# Patient Record
Sex: Female | Born: 1975 | Hispanic: No | Marital: Married | Smoking: Never smoker
Health system: Southern US, Community
[De-identification: ages and names within clinical notes are randomized; demographics above are authoritative.]

## PROBLEM LIST (undated history)

## (undated) DIAGNOSIS — R002 Palpitations: Secondary | ICD-10-CM

## (undated) DIAGNOSIS — J302 Other seasonal allergic rhinitis: Secondary | ICD-10-CM

## (undated) DIAGNOSIS — Z8719 Personal history of other diseases of the digestive system: Secondary | ICD-10-CM

## (undated) DIAGNOSIS — R112 Nausea with vomiting, unspecified: Secondary | ICD-10-CM

## (undated) DIAGNOSIS — T7840XA Allergy, unspecified, initial encounter: Secondary | ICD-10-CM

## (undated) DIAGNOSIS — Z860100 Personal history of colon polyps, unspecified: Secondary | ICD-10-CM

## (undated) DIAGNOSIS — K219 Gastro-esophageal reflux disease without esophagitis: Secondary | ICD-10-CM

## (undated) HISTORY — DX: Palpitations: R00.2

## (undated) HISTORY — DX: Gastro-esophageal reflux disease without esophagitis: K21.9

## (undated) HISTORY — DX: Personal history of colonic polyps: Z86.010

## (undated) HISTORY — PX: GANGLION CYST EXCISION: SHX1691

## (undated) HISTORY — DX: Allergy, unspecified, initial encounter: T78.40XA

## (undated) HISTORY — DX: Personal history of colon polyps, unspecified: Z86.0100

## (undated) HISTORY — DX: Other seasonal allergic rhinitis: J30.2

## (undated) HISTORY — PX: COLONOSCOPY: SHX174

## (undated) HISTORY — PX: CHOLECYSTECTOMY: SHX55

## (undated) HISTORY — PX: EYE SURGERY: SHX253

## (undated) HISTORY — DX: Personal history of other diseases of the digestive system: Z87.19

---

## 2004-01-18 ENCOUNTER — Other Ambulatory Visit: Admission: RE | Admit: 2004-01-18 | Discharge: 2004-01-18 | Payer: Self-pay | Admitting: Obstetrics and Gynecology

## 2005-03-06 ENCOUNTER — Other Ambulatory Visit: Admission: RE | Admit: 2005-03-06 | Discharge: 2005-03-06 | Payer: Self-pay | Admitting: Obstetrics and Gynecology

## 2015-08-29 DIAGNOSIS — J3089 Other allergic rhinitis: Secondary | ICD-10-CM | POA: Insufficient documentation

## 2017-01-07 DIAGNOSIS — K8020 Calculus of gallbladder without cholecystitis without obstruction: Secondary | ICD-10-CM | POA: Insufficient documentation

## 2018-04-16 ENCOUNTER — Other Ambulatory Visit: Payer: Self-pay | Admitting: Obstetrics and Gynecology

## 2018-04-16 DIAGNOSIS — R928 Other abnormal and inconclusive findings on diagnostic imaging of breast: Secondary | ICD-10-CM

## 2018-04-22 ENCOUNTER — Ambulatory Visit: Payer: Self-pay

## 2018-04-22 ENCOUNTER — Ambulatory Visit
Admission: RE | Admit: 2018-04-22 | Discharge: 2018-04-22 | Disposition: A | Discharge status: Home / Self Care | Payer: PRIVATE HEALTH INSURANCE | Source: Ambulatory Visit | Attending: Obstetrics and Gynecology | Admitting: Obstetrics and Gynecology

## 2018-04-22 DIAGNOSIS — R928 Other abnormal and inconclusive findings on diagnostic imaging of breast: Secondary | ICD-10-CM

## 2019-07-30 ENCOUNTER — Encounter: Payer: Self-pay | Admitting: Gastroenterology

## 2019-09-02 ENCOUNTER — Encounter: Payer: Self-pay | Admitting: Emergency Medicine

## 2019-09-02 ENCOUNTER — Other Ambulatory Visit: Payer: Self-pay | Admitting: Emergency Medicine

## 2019-09-07 ENCOUNTER — Encounter: Payer: Self-pay | Admitting: Gastroenterology

## 2019-09-07 ENCOUNTER — Ambulatory Visit: Payer: PRIVATE HEALTH INSURANCE | Admitting: Gastroenterology

## 2019-09-07 ENCOUNTER — Ambulatory Visit: Payer: Self-pay | Admitting: Gastroenterology

## 2019-09-07 ENCOUNTER — Encounter (INDEPENDENT_AMBULATORY_CARE_PROVIDER_SITE_OTHER): Payer: Self-pay

## 2019-09-07 VITALS — BP 130/80 | HR 83 | Temp 97.6°F | Ht 65.0 in | Wt 150.0 lb

## 2019-09-07 DIAGNOSIS — K219 Gastro-esophageal reflux disease without esophagitis: Secondary | ICD-10-CM | POA: Diagnosis not present

## 2019-09-07 DIAGNOSIS — K921 Melena: Secondary | ICD-10-CM | POA: Diagnosis not present

## 2019-09-07 NOTE — Progress Notes (Signed)
Referring Provider: No ref. provider found Primary Care Physician:  Nicholes Rough, PA-C  Reason for Consultation:  GERD and Rectal bleeding   IMPRESSION:  GERD without alarm features    - improved after one month of H2B OTC and subsequent 2 weeks of PPI OTC Rectal pain and bleeding in January    - responded to two weeks of conservative measures    - no further bleeding since that time Abdominal pain with stress Cholecystectomy 3 years ago No family history of colon cancer or polyps Father with d-IBS treated with Lotronex  Rectal bleeding was likely a fissure. But, the differential is broad and also includes hemorrhoids, as well as polyps, mass, ulcers, and colitis.  Given this differential I am recommending a colonoscopy.  No ongoing reflux at this time. No alarm features. She will resume daily PPI therapy if symptoms recur and call me.   PLAN: Reviewed lifestyle modifications PPI as needed for recurrent symptoms Colonoscopy  I consented the patient at the bedside today discussing the risks, benefits, and alternatives to endoscopic evaluation. In particular, we discussed the risks that include, but are not limited to, reaction to medication, cardiopulmonary compromise, bleeding requiring blood transfusion, aspiration resulting in pneumonia, perforation requiring surgery, lack of diagnosis, severe illness requiring hospitalization, and even death. We reviewed the risk of missed lesion including polyps or even cancer. The patient acknowledges these risks and asks that we proceed.  Please see the "Patient Instructions" section for addition details about the plan.  HPI: Claire Flynn is a 43 y.o. female self-referred for a history of reflux and rectal bleeding. 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 The history is obtained through the patient. No records are available in EPIC or CareEverywhere. She has seasonal allergies and a cholecystectomy 3 years ago for cholelithiasis at [facility].   Reflux started in March 2020. She attributes it to being stress related from coronavirus. Triggered by some foods and a glass of wine.  Maybe some improvement with probiotic.  Pepcid 20 mg OTC for 30 days resolved the reflux.  Recurred a couple of weeks later. Trial of Prilosec OTC x 14 days. No reflux since that time. Started keto three weeks ago. Huge dietary change. No cough, sore throat, neck pain, dysphonia. No nocturnal symptoms although she noticed the heartburn more in the evening. Some weight gain due to Covid. Carries stress in back and neck.   Hemorroids for years. First noticed bleeding last year. Some BRB on the toilet paper occurred in January. At that time she had pain with defecation and blood in the water and on the TP. She wonders now if it might be a fissure. Increased fiber. Hydrocortisone cream increased. Daily Metamucil. Improved in 2 weeks. No further bleeding since that time. No other associated symptoms. No identified exacerbating or relieving features.   Abdominal pain with stress. Some foods may trigger diarrhea such a greasy foods. No constipation.  Bowel movements are regular.  Increase in diarrhea after cholecystectomy, but, things are now improved.   Alcohol - Mostly wine and beer - 3 times weekly. Caffeine - coffee daily and sodas 2-3 times weekly. Keto beverage has some coffeeine.   Uses Vitamin E, Vitamin D, Allegra. TrueBiotic for overall gut health.   Father had severe diarrhea IBS that was diagnosed in his 50-50s. Controlled on Lotronex. No known family history of colon cancer or polyps. No family history of uterine/endometrial cancer, pancreatic cancer or gastric/stomach cancer.   Past  Medical History:  Diagnosis Date  . Seasonal allergies     Past Surgical History:  Procedure Laterality Date  . CHOLECYSTECTOMY    . GANGLION CYST EXCISION      Current Outpatient Medications  Medication Sig Dispense Refill  . Cholecalciferol  (VITAMIN D3) 50 MCG (2000 UT) capsule Vitamin D 2,000 unit capsule   1 capsule every day by oral route.    . fexofenadine (ALLEGRA ALLERGY) 180 MG tablet Take 180 mg by mouth daily.    . norethindrone-ethinyl estradiol (JUNEL 1/20) 1-20 MG-MCG tablet Take 1 tablet by mouth daily.    . Olopatadine HCl (PATANASE) 0.6 % SOLN Place 0.6 sprays into the nose 2 (two) times daily.    . Probiotic Product (PROBIOTIC PO) Probiotic    . vitamin E 200 UNIT capsule vitamin E 200 unit capsule   1 capsule every day by oral route.     No current facility-administered medications for this visit.     Allergies as of 09/07/2019  . (No Known Allergies)    Family History  Problem Relation Age of Onset  . Hypertension Mother   . Hypertension Father   . Melanoma Father     Social History   Socioeconomic History  . Marital status: Unknown    Spouse name: Not on file  . Number of children: Not on file  . Years of education: Not on file  . Highest education level: Not on file  Occupational History  . Not on file  Social Needs  . Financial resource strain: Not on file  . Food insecurity    Worry: Not on file    Inability: Not on file  . Transportation needs    Medical: Not on file    Non-medical: Not on file  Tobacco Use  . Smoking status: Never Smoker  . Smokeless tobacco: Never Used  Substance and Sexual Activity  . Alcohol use: Yes  . Drug use: Never  . Sexual activity: Not on file  Lifestyle  . Physical activity    Days per week: Not on file    Minutes per session: Not on file  . Stress: Not on file  Relationships  . Social Herbalist on phone: Not on file    Gets together: Not on file    Attends religious service: Not on file    Active member of club or organization: Not on file    Attends meetings of clubs or organizations: Not on file    Relationship status: Not on file  . Intimate partner violence    Fear of current or ex partner: Not on file    Emotionally  abused: Not on file    Physically abused: Not on file    Forced sexual activity: Not on file  Other Topics Concern  . Not on file  Social History Narrative  . Not on file    Review of Systems: 12 system ROS is negative except as noted above.   Physical Exam: General:   Alert,  well-nourished, pleasant and cooperative in NAD Head:  Normocephalic and atraumatic. Eyes:  Sclera clear, no icterus.   Conjunctiva pink. Ears:  Normal auditory acuity. Nose:  No deformity, discharge,  or lesions. Mouth:  No deformity or lesions.   Neck:  Supple; no masses or thyromegaly. Lungs:  Clear throughout to auscultation.   No wheezes. Heart:  Regular rate and rhythm; no murmurs. Abdomen:  Soft,nontender, nondistended, normal bowel sounds, no rebound or guarding. No hepatosplenomegaly.  Rectal:  Deferred  Msk:  Symmetrical. No boney deformities LAD: No inguinal or umbilical LAD Extremities:  No clubbing or edema. Neurologic:  Alert and  oriented x4;  grossly nonfocal Skin:  Intact without significant lesions or rashes. Psych:  Alert and cooperative. Normal mood and affect.    Claire Flynn L. Tarri Glenn, MD, MPH 09/07/2019, 6:42 PM

## 2019-09-07 NOTE — Patient Instructions (Signed)
It has been recommended to you by your physician that you have a(n) colonoscopy completed. Per your request, we did not schedule the procedure today. Please contact our office at [phone] should you decide to have the procedure completed.

## 2019-09-20 ENCOUNTER — Other Ambulatory Visit: Payer: Self-pay | Admitting: Emergency Medicine

## 2019-09-20 DIAGNOSIS — K921 Melena: Secondary | ICD-10-CM

## 2019-09-20 MED ORDER — NA SULFATE-K SULFATE-MG SULF 17.5-3.13-1.6 GM/177ML PO SOLN: 1.0000 | ORAL | 0 refills | Status: AC

## 2019-09-20 NOTE — Progress Notes (Signed)
Patient scheduled for 10-25-2019 with Dr Fuller Plan per patients request.

## 2019-09-24 ENCOUNTER — Other Ambulatory Visit: Payer: Self-pay | Admitting: Orthopaedic Surgery

## 2019-09-24 MED ORDER — DOXYCYCLINE HYCLATE 100 MG PO TABS: 100.0000 mg | ORAL_TABLET | Freq: Two times a day (BID) | ORAL | 0 refills | Status: DC

## 2019-10-21 ENCOUNTER — Encounter: Payer: Self-pay | Admitting: Gastroenterology

## 2019-10-25 ENCOUNTER — Other Ambulatory Visit: Payer: Self-pay

## 2019-10-25 ENCOUNTER — Ambulatory Visit (AMBULATORY_SURGERY_CENTER): Payer: PRIVATE HEALTH INSURANCE | Admitting: Gastroenterology

## 2019-10-25 ENCOUNTER — Other Ambulatory Visit: Payer: Self-pay | Admitting: Gastroenterology

## 2019-10-25 ENCOUNTER — Encounter: Payer: Self-pay | Admitting: Gastroenterology

## 2019-10-25 VITALS — BP 124/84 | HR 84 | Temp 98.9°F | Resp 16 | Ht 65.0 in | Wt 150.0 lb

## 2019-10-25 DIAGNOSIS — D122 Benign neoplasm of ascending colon: Secondary | ICD-10-CM

## 2019-10-25 DIAGNOSIS — D125 Benign neoplasm of sigmoid colon: Secondary | ICD-10-CM

## 2019-10-25 DIAGNOSIS — D123 Benign neoplasm of transverse colon: Secondary | ICD-10-CM

## 2019-10-25 DIAGNOSIS — K921 Melena: Secondary | ICD-10-CM | POA: Diagnosis present

## 2019-10-25 DIAGNOSIS — K644 Residual hemorrhoidal skin tags: Secondary | ICD-10-CM | POA: Diagnosis not present

## 2019-10-25 DIAGNOSIS — K5730 Diverticulosis of large intestine without perforation or abscess without bleeding: Secondary | ICD-10-CM

## 2019-10-25 DIAGNOSIS — K648 Other hemorrhoids: Secondary | ICD-10-CM | POA: Diagnosis not present

## 2019-10-25 MED ORDER — SODIUM CHLORIDE 0.9 % IV SOLN: 500.0000 mL | Freq: Once | INTRAVENOUS | Status: DC

## 2019-10-25 NOTE — Progress Notes (Signed)
Temperature- [place]

## 2019-10-25 NOTE — Progress Notes (Signed)
Called to room to assist during endoscopic procedure.  Patient ID and intended procedure confirmed with present staff. Received instructions for my participation in the procedure from the performing physician.  

## 2019-10-25 NOTE — Patient Instructions (Signed)
Thank you for allowing [place] to care for you today!  Await pathology results by mail, approximately 2 weeks.  Recommendation for next colonoscopy will be made at that time.  Surveillance colonoscopy with Dr Tarri Glenn.  Resume previous diet today.  Return to your normal activities tomorrow.  Recommend high fiber diet.  Resume present medications today, but avoiding aspirin, ibuprofen,  naproxen ( ALL NSAIDS) for 2 weeks.  May use Anusol HC cream and  RectiCare both per rectum twice per day as needed.       YOU HAD AN ENDOSCOPIC PROCEDURE TODAY AT [facility]:   Refer to the procedure report that was given to you for any specific questions about what was found during the examination.  If the procedure report does not answer your questions, please call your gastroenterologist to clarify.  If you requested that your care partner not be given the details of your procedure findings, then the procedure report has been included in a sealed envelope for you to review at your convenience later.  YOU SHOULD EXPECT: Some feelings of bloating in the abdomen. Passage of more gas than usual.  Walking can help get rid of the air that was put into your GI tract during the procedure and reduce the bloating. If you had a lower endoscopy (such as a colonoscopy or flexible sigmoidoscopy) you may notice spotting of blood in your stool or on the toilet paper. If you underwent a bowel prep for your procedure, you may not have a normal bowel movement for a few days.  Please Note:  You might notice some irritation and congestion in your nose or some drainage.  This is from the oxygen used during your procedure.  There is no need for concern and it should clear up in a day or so.  SYMPTOMS TO REPORT IMMEDIATELY:   Following lower endoscopy (colonoscopy or flexible sigmoidoscopy):  Excessive amounts of blood in the stool  Significant tenderness or worsening of abdominal pains  Swelling of the abdomen  that is new, acute  Fever of 100F or higher   For urgent or emergent issues, a gastroenterologist can be reached at any hour by calling [phone].   DIET:  We do recommend a small meal at first, but then you may proceed to your regular diet.  Drink plenty of fluids but you should avoid alcoholic beverages for 24 hours.  ACTIVITY:  You should plan to take it easy for the rest of today and you should NOT DRIVE or use heavy machinery until tomorrow (because of the sedation medicines used during the test).    FOLLOW UP: Our staff will call the number listed on your records 48-72 hours following your procedure to check on you and address any questions or concerns that you may have regarding the information given to you following your procedure. If we do not reach you, we will leave a message.  We will attempt to reach you two times.  During this call, we will ask if you have developed any symptoms of COVID 19. If you develop any symptoms (ie: fever, flu-like symptoms, shortness of breath, cough etc.) before then, please call [phone].  If you test positive for Covid 19 in the 2 weeks post procedure, please call and report this information to [place].    If any biopsies were taken you will be contacted by phone or by letter within the next 1-3 weeks.  Please call us at [phone] if you have not heard  about the biopsies in 3 weeks.    SIGNATURES/CONFIDENTIALITY: You and/or your care partner have signed paperwork which will be entered into your electronic medical record.  These signatures attest to the fact that that the information above on your After Visit Summary has been reviewed and is understood.  Full responsibility of the confidentiality of this discharge information lies with you and/or your care-partner.

## 2019-10-25 NOTE — Op Note (Addendum)
[place] Patient Name: Claire Flynn Procedure Date: 10/25/2019 8:01 AM MRN: [id number] Endoscopist: Ladene Artist , MD Age: 43 Referring MD:  Date of Birth: Apr 27, 1976 Gender: Female Account #: [id number] Procedure:                Colonoscopy Indications:              Hematochezia Medicines:                Monitored Anesthesia Care Procedure:                Pre-Anesthesia Assessment:                           - Prior to the procedure, a History and Physical                            was performed, and patient medications and                            allergies were reviewed. The patient's tolerance of                            previous anesthesia was also reviewed. The risks                            and benefits of the procedure and the sedation                            options and risks were discussed with the patient.                            All questions were answered, and informed consent                            was obtained. Prior Anticoagulants: The patient has                            taken no previous anticoagulant or antiplatelet                            agents. ASA Grade Assessment: II - A patient with                            mild systemic disease. After reviewing the risks                            and benefits, the patient was deemed in                            satisfactory condition to undergo the procedure.                           After obtaining informed consent, the colonoscope  was passed under direct vision. Throughout the                            procedure, the patient's blood pressure, pulse, and                            oxygen saturations were monitored continuously. The                            Colonoscope was introduced through the anus and                            advanced to the the terminal ileum, with                            identification of the appendiceal orifice and IC                      valve. The ileocecal valve, appendiceal orifice,                            and rectum were photographed. The quality of the                            bowel preparation was excellent. The colonoscopy                            was performed without difficulty. The patient                            tolerated the procedure well. Scope In: 8:13:43 AM Scope Out: 8:31:14 AM Scope Withdrawal Time: 0 hours 15 minutes 22 seconds  Total Procedure Duration: 0 hours 17 minutes 31 seconds  Findings:                 Skin tags were found on perianal exam. No fissure                            noted.                           The terminal ileum appeared normal.                           A 9 mm polyp was found in the sigmoid colon. The                            polyp was semi-pedunculated. The polyp was removed                            with a cold snare. Resection and retrieval were                            complete.  Two sessile polyps were found in the transverse                            colon and ascending colon. The polyps were 7 to 8                            mm in size. These polyps were removed with a cold                            snare. Resection and retrieval were complete.                           A few medium-mouthed diverticula were found in the                            left colon. There was no evidence of diverticular                            bleeding.                           Internal hemorrhoids were found during                            retroflexion. The hemorrhoids were small and Grade                            I (internal hemorrhoids that do not prolapse).                           The exam was otherwise without abnormality on                            direct and retroflexion views. Complications:            No immediate complications. Estimated blood loss:                            None. Estimated Blood Loss:      Estimated blood loss: none. Impression:               - Perianal skin tags found on perianal exam.                           - Normal appearing terminal ileum.                           - One 9 mm polyp in the sigmoid colon, removed with                            a cold snare. Resected and retrieved.                           - Two 7 to 8 mm polyps in the transverse colon and  in the ascending colon, removed with a cold snare.                            Resected and retrieved.                           - Mild diverticulosis in the left colon.                           - Internal hemorrhoids.                           - The examination was otherwise normal on direct                            and retroflexion views. Recommendation:           - Repeat colonoscopy after studies are complete for                            surveillance based on pathology results with Dr.                            Tarri Glenn.                           - Patient has a contact number available for                            emergencies. The signs and symptoms of potential                            delayed complications were discussed with the                            patient. Return to normal activities tomorrow.                            Written discharge instructions were provided to the                            patient.                           - High fiber diet.                           - Continue present medications.                           - No aspirin, ibuprofen, naproxen, or other                            non-steroidal anti-inflammatory drugs for 2 weeks                            after polyp removal.                           -  Anusol HC cream PR bid prn. RectiCare PR bid prn Ladene Artist, MD 10/25/2019 8:37:24 AM This report has been signed electronically.

## 2019-10-25 NOTE — Progress Notes (Signed)
Report given to PACU, vss 

## 2019-10-26 ENCOUNTER — Telehealth: Payer: Self-pay | Admitting: Gastroenterology

## 2019-10-27 ENCOUNTER — Telehealth: Payer: Self-pay

## 2019-10-27 ENCOUNTER — Encounter: Payer: Self-pay | Admitting: Gastroenterology

## 2019-10-27 NOTE — Telephone Encounter (Signed)
The note I saw indicated she was going for a covid-19 test which was reasonable. I assumed one of the nurses called her back.

## 2019-10-27 NOTE — Telephone Encounter (Signed)
  Follow up Call-  Call back number 10/25/2019  Post procedure Call Back phone  # [phone]  Permission to leave phone message Yes  Some recent data might be hidden     Patient questions:  Do you have a fever, pain , or abdominal swelling? No. Pain Score  0 *  Have you tolerated food without any problems? Yes.    Have you been able to return to your normal activities? Yes.    Do you have any questions about your discharge instructions: Diet   No. Medications  No. Follow up visit  No.  Do you have questions or concerns about your Care? Yes.    Pt states she called yesterday and no one called her back, states she had a temp of 99 and lost taste and smell.  States she had a covid test and has not received results yet.  Pt states she will let us know if it is positive.  Actions: * If pain score is 4 or above: Physician/ provider Notified : Lucio Edward, MD.  1. Have you developed a fever since your procedure? no  2.   Have you had an respiratory symptoms (SOB or cough) since your procedure? Yes- lost taste and smell  3.   Have you tested positive for COVID 19 since your procedure no, results not back yet.  4.   Have you had any family members/close contacts diagnosed with the COVID 19 since your procedure?  no   If yes to any of these questions please route to Joylene John, RN and Alphonsa Gin, Therapist, sports.

## 2019-10-27 NOTE — Telephone Encounter (Signed)
Positive Covid-19 following colonoscopy noted. Please advise her to contact her PCP for Covid-19 care and follow up. We wish her a speedy and uneventful recovery.

## 2019-10-29 ENCOUNTER — Telehealth: Payer: Self-pay

## 2019-10-29 NOTE — Telephone Encounter (Signed)
Contacted patient for follow up on covid positive diagnosis.  Patient states she is 'doing fine' with no significant problems.  Is quarantining at home.  Advised per Dr. Fuller Plan to follow up with PCP.  Patient states she received covid test and instructions via hospital testing but has not yet talked to PCP.  Advised Dr. Fuller Plan recommended notification of PCP and do not delay in seeking care if any developing concerns.  Patient acknowledged understanding.  Nothing further needed.

## 2019-11-14 IMAGING — MG DIGITAL DIAGNOSTIC UNILATERAL RIGHT MAMMOGRAM WITH TOMO AND CAD
4 series · 4 of 12 positions shown · non-contrast
Comparison: 04/14/2018.

CLINICAL DATA: Recall from initial baseline screening mammography
with tomosynthesis, possible asymmetry involving the INNER RIGHT
breast visible only on the CC images.

EXAM:
DIGITAL DIAGNOSTIC UNILATERAL RIGHT MAMMOGRAM WITH CAD AND TOMO

[R CC synth-2D]
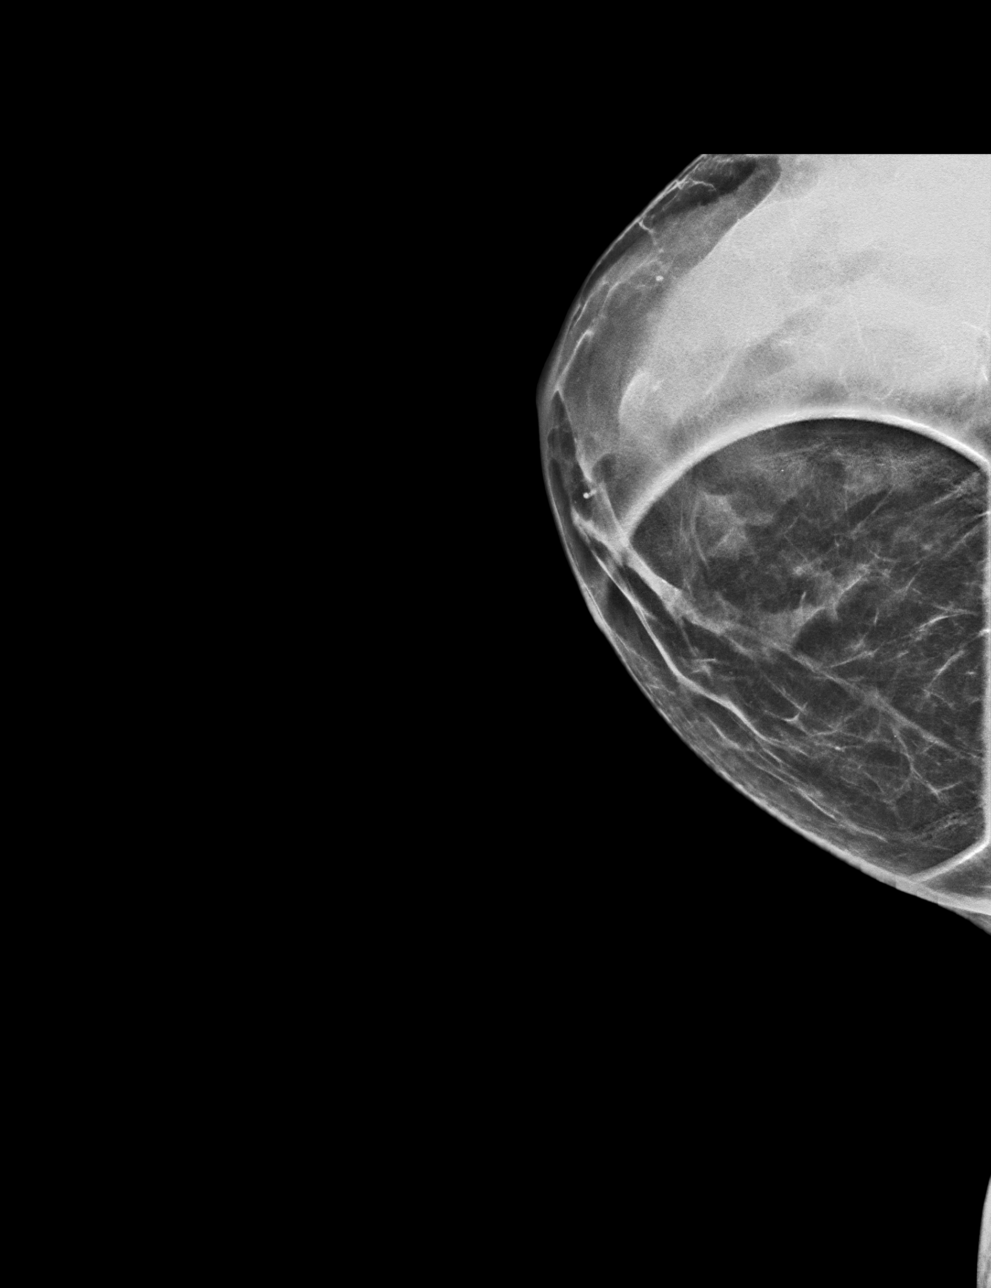

[R ML synth-2D]
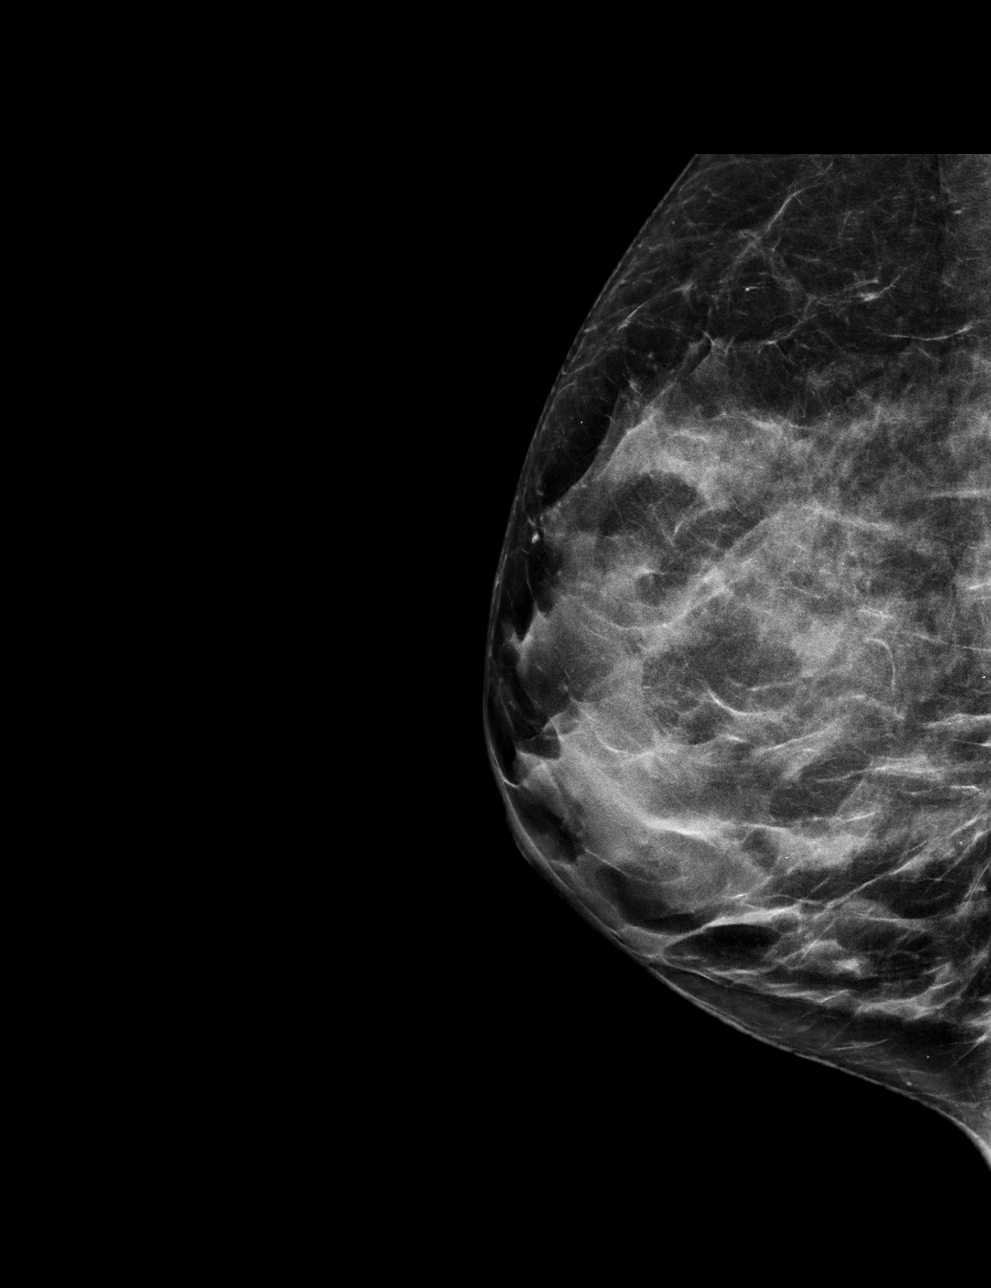

[R CC tomo · tomo slice 27/53.0]
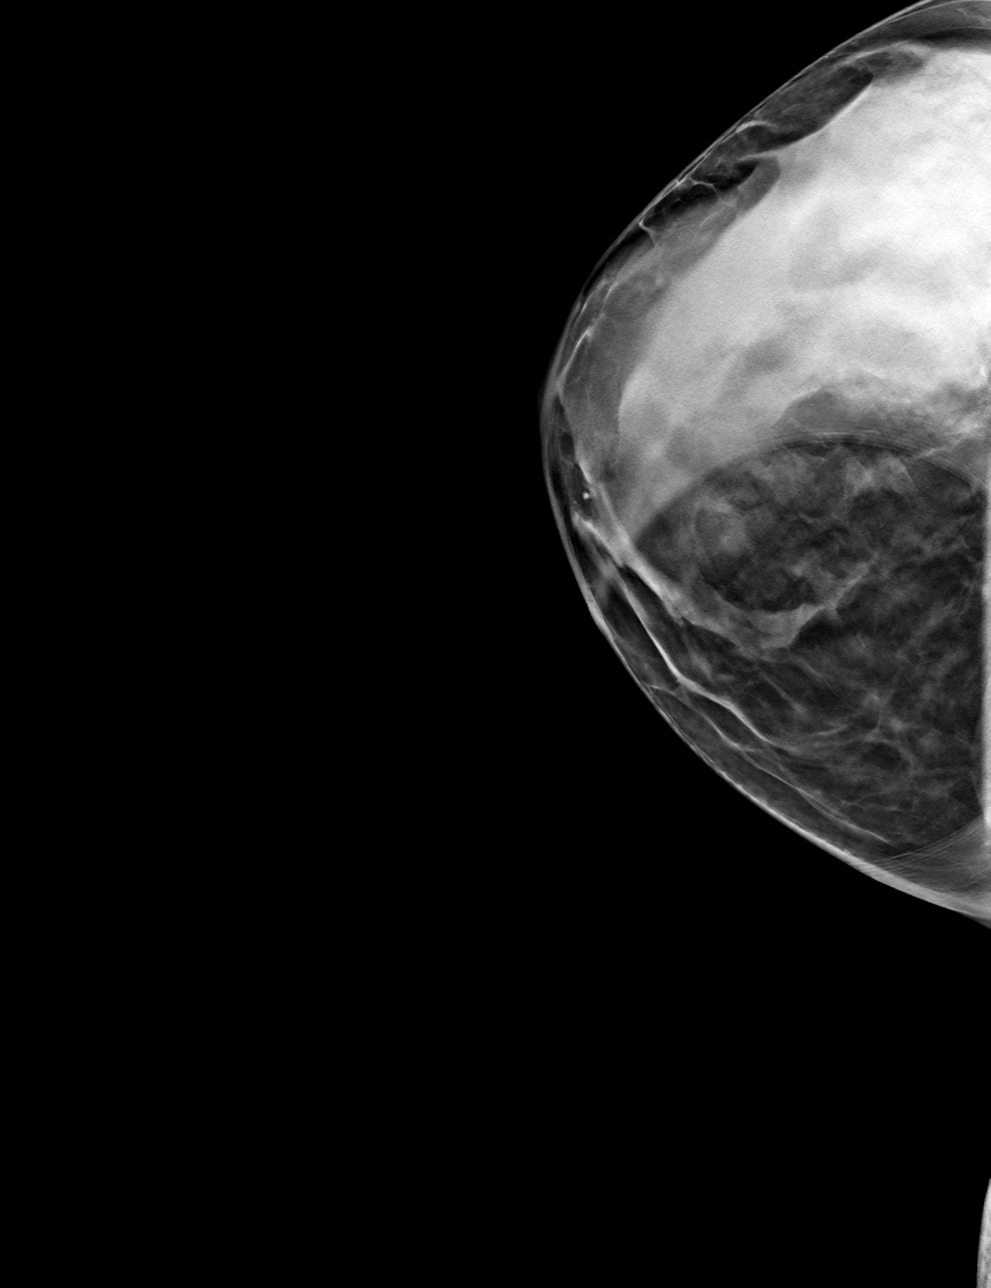

[R ML tomo · tomo slice 31/61.0]
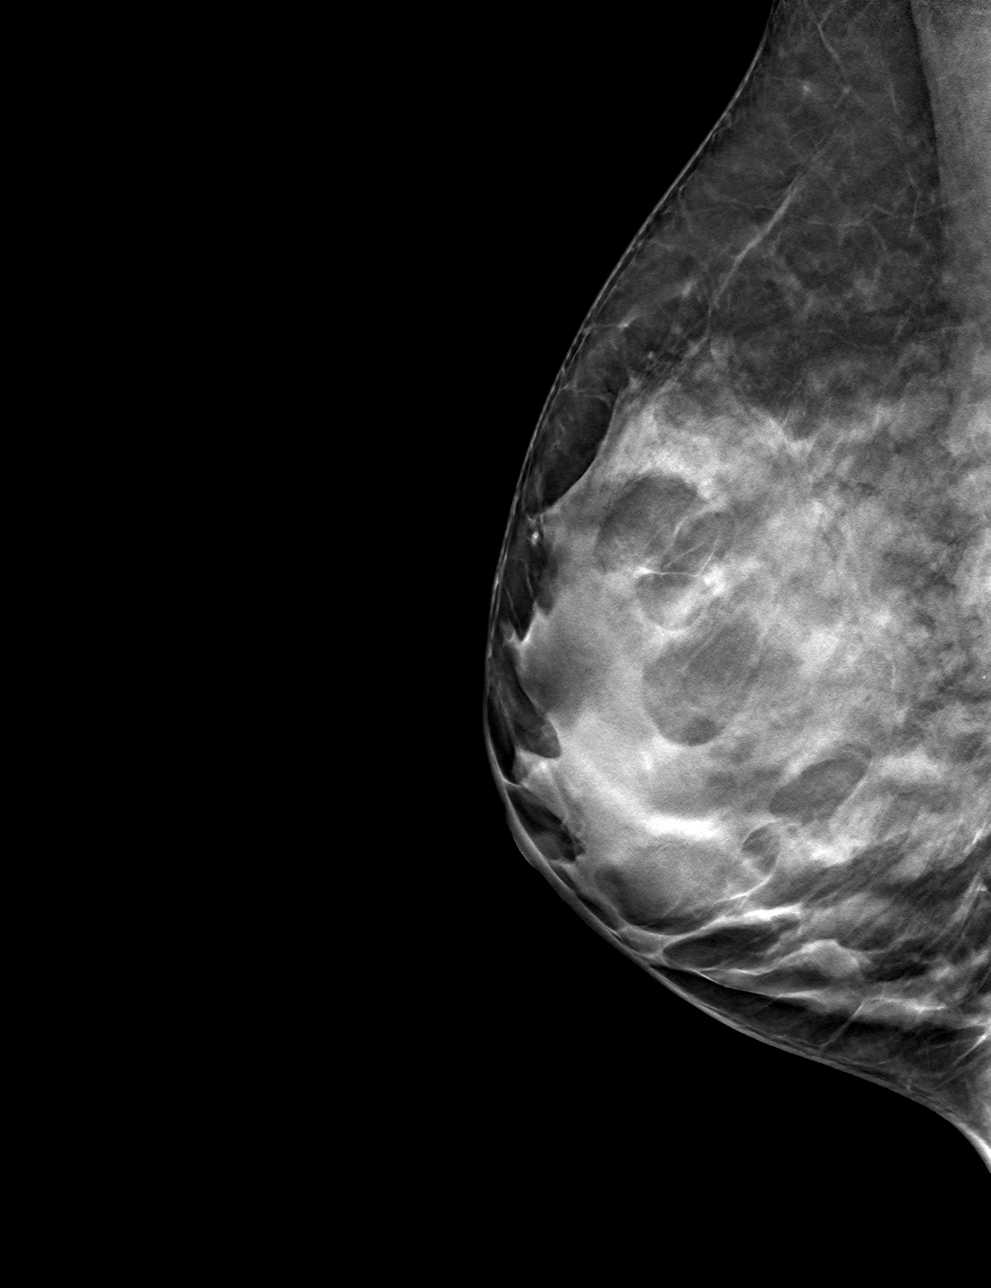

[4 of 12 positions shown; findings below may reference images not displayed]

ACR Breast Density Category d: The breast tissue is extremely dense,
which lowers the sensitivity of mammography.
FINDINGS: Tomosynthesis and synthesized spot-compression CC view of the area
of concern in the RIGHT breast and a tomosynthesis and synthesized
full field mediolateral view of the RIGHT breast were obtained.

The asymmetry involving the INNER RIGHT breast disperses with
compression and there is no underlying mass or architectural
distortion.

No findings suspicious for malignancy in the RIGHT breast

The full field mediolateral image was processed with CAD.
IMPRESSION: 1. No mammographic evidence of malignancy involving the RIGHT
breast.
2. Asymmetric normal fibroglandular tissue accounts for the
screening mammographic asymmetry.

RECOMMENDATION:
Screening mammogram in one year.(Code:[id number])

I have discussed the findings and recommendations with the patient.
Results were also provided in writing at the conclusion of the
visit. If applicable, a reminder letter will be sent to the patient
regarding the next appointment.

BI-RADS CATEGORY  1: Negative.

## 2020-03-06 ENCOUNTER — Telehealth: Payer: Self-pay | Admitting: Gastroenterology

## 2020-03-06 NOTE — Telephone Encounter (Signed)
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 Please advise

## 2020-03-06 NOTE — Telephone Encounter (Signed)
I am sorry that she is having so much difficulty despite medical therapy. We do not excise thrombosed hemorrhoids here. I believe that [facility] has a daily walk in clinic for this concern. Please see if they could see her today. Thank you.

## 2020-03-06 NOTE — Telephone Encounter (Signed)
Dr. Tarri Glenn, this patient is c/o intense pain from her hemorrhoids that she thinks "is pretty sure thrombosed." She reports the pain radiates to her groin, she has used prep H, suppositories, and sitz baths. Please advise.  She mentioned "draining the clot" from the hemorrhoid. Please advise. No same day appointment available.

## 2020-03-06 NOTE — Telephone Encounter (Addendum)
Called [facility], after collecting insurance information, they forwarded me to the triage nurse who scheduled her at 3/16 at 10 am (arrival), 10:30 am appt time. Patient notified.

## 2020-03-06 NOTE — Telephone Encounter (Signed)
Pt would like to discuss her issue with hemorrhoids.

## 2020-09-27 ENCOUNTER — Ambulatory Visit: Payer: PRIVATE HEALTH INSURANCE | Admitting: Plastic Surgery

## 2020-09-27 ENCOUNTER — Encounter: Payer: Self-pay | Admitting: Plastic Surgery

## 2020-09-27 ENCOUNTER — Other Ambulatory Visit: Payer: Self-pay

## 2020-09-27 VITALS — BP 127/85 | HR 79 | Temp 98.8°F | Ht 65.0 in | Wt 145.2 lb

## 2020-09-27 DIAGNOSIS — M79645 Pain in left finger(s): Secondary | ICD-10-CM | POA: Diagnosis not present

## 2020-09-27 DIAGNOSIS — M25532 Pain in left wrist: Secondary | ICD-10-CM | POA: Diagnosis not present

## 2020-09-27 NOTE — Progress Notes (Signed)
Referring Provider Nicholes Rough, PA-C [place],  [place] [address]   CC:  Chief Complaint  Patient presents with  . Advice Only      Claire Flynn is an 44 y.o. female.  HPI: Patient presents to discuss her left wrist.  She has had problems on and off with it for several years.  She has had steroid injections in the past that have given her sustained relief.  She has had x-rays in the past with no obvious findings according to her.  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  This is on the ulnar side of both wrists.  Additionally in the left hand she is noticed tenderness over the volar aspect in the small finger MP joint area.  She has not had any locking or triggering.  She is interested in additional steroid injections.  No Known Allergies  Outpatient Encounter Medications as of 09/27/2020  Medication Sig  . Cholecalciferol (VITAMIN D3) 50 MCG (2000 UT) capsule Vitamin D 2,000 unit capsule   1 capsule every day by oral route.  . fexofenadine (ALLEGRA ALLERGY) 180 MG tablet Take 180 mg by mouth daily.  . fluticasone (FLONASE) 50 MCG/ACT nasal spray 2 (two) times daily.  . norethindrone-ethinyl estradiol (JUNEL 1/20) 1-20 MG-MCG tablet Take 1 tablet by mouth daily.  . Olopatadine HCl (PATANASE) 0.6 % SOLN Place 0.6 sprays into the nose 2 (two) times daily.  . Probiotic Product (PROBIOTIC PO) Probiotic  . vitamin E 200 UNIT capsule vitamin E 200 unit capsule   1 capsule every day by oral route.  . doxycycline (VIBRA-TABS) 100 MG tablet Take 1 tablet (100 mg total) by mouth 2 (two) times daily.   No facility-administered encounter medications on file as of 09/27/2020.     Past Medical History:  Diagnosis Date  . Allergy   . GERD (gastroesophageal reflux disease)    occasionally reflux  . Seasonal allergies     Past Surgical History:   Procedure Laterality Date  . CHOLECYSTECTOMY    . GANGLION CYST EXCISION      Family History  Problem Relation Age of Onset  . Hypertension Mother   . Hypertension Father   . Melanoma Father   . Colon cancer Neg Hx   . Esophageal cancer Neg Hx   . Stomach cancer Neg Hx   . Rectal cancer Neg Hx     Social History   Social History Narrative  . Not on file     Review of Systems General: Denies fevers, chills, weight loss CV: Denies chest pain, shortness of breath, palpitations  Physical Exam Vitals with BMI 09/27/2020 10/25/2019 10/25/2019  Height 5\' 5"  - -  Weight 145 lbs 3 oz - -  BMI 18.56 - -  Systolic 314 970 263  Diastolic 85 84 80  Pulse 79 84 81    General:  No acute distress,  Alert and oriented, Non-Toxic, Normal speech and affect Left hand: Fingers well-perfused with normal cap refill and a palp radial pulse.  Sensation is intact throughout.  She has normal strength and range of motion.  She is tender in the area of the ulnar fovea.  Resisted pronation and supination reproduce the pain.  Shucking of the DRUJ does not reproduce the pain.  She is focally tender over the volar aspect of the fifth finger MP joint close to  the area of the A1 pulley.  She has not tender dorsally or on the ulnar aspect of the joint.  She has good range of motion but notices tightness with full effort flexion.  She does have a palpable nodule in the small finger flexor tendons.  Assessment/Plan Patient presents with chronic left wrist pain that she attributes to repetitive movements and retraction.  She has had steroid injections in the past for this which have helped.  We discussed performing an additional injection today and following this up with an x-ray to look for any other changes.  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  We also discussed the potential for rheumatoid pathology but she does not have pain in any other joints.  Regarding the small finger MP tenderness I think  the most likely explanation is an early trigger finger type phenomenon.  The joint itself does not swell and is completely nontender dorsally.  The tenderness is focused in the area of the A1 pulley volarly.  I recommend steroid injection for both of these problems.  We discussed the risk that include bleeding, infection, damage to surrounding structures and need for additional procedures.  Both areas were prepped with an alcohol pad.  Local anesthesia and Kenalog was injected in the area of the TFCC from an ulnar approach and also in the area of the A1 pulley to the small finger.  She tolerated this well.  I will plan to order an x-ray for her and call her with results.  Cindra Presume 09/27/2020, 9:24 PM

## 2020-09-29 ENCOUNTER — Ambulatory Visit (HOSPITAL_COMMUNITY)
Admission: RE | Admit: 2020-09-29 | Discharge: 2020-09-29 | Disposition: A | Discharge status: Home / Self Care | Payer: PRIVATE HEALTH INSURANCE | Source: Ambulatory Visit | Attending: Plastic Surgery | Admitting: Plastic Surgery

## 2020-09-29 ENCOUNTER — Other Ambulatory Visit: Payer: Self-pay

## 2020-09-29 DIAGNOSIS — M25532 Pain in left wrist: Secondary | ICD-10-CM | POA: Diagnosis not present

## 2020-10-31 ENCOUNTER — Encounter: Payer: Self-pay | Admitting: Cardiology

## 2020-10-31 ENCOUNTER — Ambulatory Visit: Payer: PRIVATE HEALTH INSURANCE | Admitting: Cardiology

## 2020-10-31 ENCOUNTER — Other Ambulatory Visit: Payer: Self-pay

## 2020-10-31 ENCOUNTER — Other Ambulatory Visit (INDEPENDENT_AMBULATORY_CARE_PROVIDER_SITE_OTHER): Payer: PRIVATE HEALTH INSURANCE

## 2020-10-31 VITALS — BP 126/84 | HR 99 | Ht 65.0 in | Wt 141.0 lb

## 2020-10-31 DIAGNOSIS — R002 Palpitations: Secondary | ICD-10-CM

## 2020-10-31 NOTE — Progress Notes (Signed)
Electrophysiology Office Note   Date:  10/31/2020   ID:  Claire Flynn, DOB [birth year], MRN [id number]  PCP:  Claire Rough, PA-C  Cardiologist:   Primary Electrophysiologist:  Nayson Traweek Meredith Leeds, MD    Chief Complaint: Palpitations   History of Present Illness: Claire Flynn is a 44 y.o. female who is being seen today for the evaluation of palpitations at the request of Claire Flynn, [place]. Presenting today for electrophysiology evaluation.  She presents today for the evaluation of palpitations.  She was diagnosed with Covid approximately 1 year ago.  She fully recovered.  She had done well up until she went on vacation the week before Halloween.  She had approximately 3-4 drinks a day during that vacation where she usually has 3 to 4/week.  When she returned home, she started having palpitations.  She says that her palpitations feel like a pause followed by a very strong beat.  These occurred for multiple days in a row, and then went away.  They have come back and make her feel weak and fatigued, and she is quite worried about her overall symptoms.  Last Sunday, she awoke and felt poorly, felt her pulse and was found to be irregularly irregular.  Throughout the day, this feeling went away, but she continued to have similar palpitations as above.  Today, she denies symptoms of palpitations, chest pain, shortness of breath, orthopnea, PND, lower extremity edema, claudication, dizziness, presyncope, syncope, bleeding, or neurologic sequela. The patient is tolerating medications without difficulties.    Past Medical History:  Diagnosis Date  . Allergy   . GERD (gastroesophageal reflux disease)    occasionally reflux  . Palpitations   . Seasonal allergies    Past Surgical History:  Procedure Laterality Date  . CHOLECYSTECTOMY    . GANGLION CYST EXCISION       Current Outpatient Medications  Medication Sig Dispense Refill  . APPLE CIDER VINEGAR PO Take 480 mg by mouth daily.    . B  Complex Vitamins (VITAMIN B COMPLEX) TABS Take by mouth daily.    . Cholecalciferol (VITAMIN D3) 50 MCG (2000 UT) capsule Vitamin D 2,000 unit capsule   1 capsule every day by oral route.    . fexofenadine (ALLEGRA ALLERGY) 180 MG tablet Take 180 mg by mouth daily.    . fluticasone (FLONASE) 50 MCG/ACT nasal spray 2 (two) times daily.    . norethindrone-ethinyl estradiol (JUNEL 1/20) 1-20 MG-MCG tablet Take 1 tablet by mouth daily.    . Nutritional Supplements (VITAL HIGH PROTEIN PO) Take 10 mg by mouth daily.    . Olopatadine HCl (PATANASE) 0.6 % SOLN Place 0.6 sprays into the nose 2 (two) times daily.    . Probiotic Product (PROBIOTIC PO) Probiotic    . Specialty Vitamins Products (BIOTIN PLUS KERATIN PO) Take 10,000 mg by mouth daily.    . Vitamin E 180 MG (400 UNIT) CAPS Take by mouth daily.      No current facility-administered medications for this visit.    Allergies:   Patient has no known allergies.   Social History:  The patient  reports that she has never smoked. She has never used smokeless tobacco. She reports current alcohol use. She reports that she does not use drugs.   Family History:  The patient's family history includes Hypertension in her father and mother; Melanoma in her father.    ROS:  Please see the history of present illness.   Otherwise, review of systems is positive for  none.   All other systems are reviewed and negative.    PHYSICAL EXAM: VS:  BP 126/84   Pulse 99   Ht 5\' 5"  (1.651 m)   Wt 141 lb (64 kg)   SpO2 96%   BMI 23.46 kg/m  , BMI Body mass index is 23.46 kg/m. GEN: Well nourished, well developed, in no acute distress  HEENT: normal  Neck: no JVD, carotid bruits, or masses Cardiac: RRR; no murmurs, rubs, or gallops,no edema  Respiratory:  clear to auscultation bilaterally, normal work of breathing GI: soft, nontender, nondistended, + BS MS: no deformity or atrophy  Skin: warm and dry Neuro:  Strength and sensation are intact Psych:  euthymic mood, full affect  EKG:  EKG is ordered today. Personal review of the ekg ordered shows sinus rhythm, rate 99  Recent Labs: No results found for requested labs within last 8760 hours.    Lipid Panel  No results found for: CHOL, TRIG, HDL, CHOLHDL, VLDL, LDLCALC, LDLDIRECT   Wt Readings from Last 3 Encounters:  10/31/20 141 lb (64 kg)  09/27/20 145 lb 3.2 oz (65.9 kg)  10/25/19 150 lb (68 kg)      Other studies Reviewed: Additional studies/ records that were reviewed today include: Epic notes    ASSESSMENT AND PLAN:  1.  Palpitations: Unclear to me as to the cause of her palpitations.  There is no documented cardiac history, and her palpitations are worse since her vacation.  She initially thought that this was holiday heart, but had quite a bit of hydration.  He does sound like she may be having PVCs.  I Aleece Loyd have her wear a monitor for 1 week.    Current medicines are reviewed at length with the patient today.   The patient does not have concerns regarding her medicines.  The following changes were made today:  none  Labs/ tests ordered today include:  Orders Placed This Encounter  Procedures  . LONG TERM MONITOR (3-14 DAYS)  . EKG 12-Lead     Disposition:   FU with Gailen Venne 1 months  Signed, Earsel Shouse Meredith Leeds, MD  10/31/2020 10:52 AM     [facility] [address] [place] [address] [phone] (office) [phone] (fax)

## 2020-10-31 NOTE — Patient Instructions (Signed)
Medication Instructions:  Your physician recommends that you continue on your current medications as directed. Please refer to the Current Medication list given to you today.  *If you need a refill on your cardiac medications before your next appointment, please call your pharmacy*   Lab Work: None ordered   Testing/Procedures: Your physician has recommended that you wear a 7 DAY ZIO-PATCH monitor. The Zio patch cardiac monitor continuously records heart rhythm data, this is for patients being evaluated for multiple types heart rhythms. For the first 24 hours post application, please avoid getting the Zio monitor wet in the shower or by excessive sweating during exercise. After that, feel free to carry on with regular activities. Keep soaps and lotions away from the ZIO XT Patch.  Someone will be calling you to let you know when this is mailed.  This will be mailed to you, please expect 7-10 days to receive.      Call Gamaliel at [phone] if you have questions regarding your ZIO XT patch monitor.  Call them immediately if you see an orange light blinking on your monitor.   If your monitor falls off in less than 4 days contact our Monitor department at [phone].  If your monitor becomes loose or falls off after 4 days call [employer] at [phone] for suggestions on securing your monitor     Follow-Up: At [facility], you and your health needs are our priority.  As part of our continuing mission to provide you with exceptional heart care, we have created designated Provider Care Teams.  These Care Teams include your primary Cardiologist (physician) and Advanced Practice Providers (APPs -  Physician Assistants and Nurse Practitioners) who all work together to provide you with the care you need, when you need it.   Your next appointment:    to be determined after monitor is complete  The format for your next appointment:   In Person  Provider:     Allegra Lai, MD    Thank you for choosing [facility]!!   Trinidad Curet, RN [phone]   Other Instructions  ZIO XT- Long Term Monitor Instructions   Your physician has requested you wear your ZIO patch monitor_______days.   This is a single patch monitor.  [employer] supplies one patch monitor per enrollment.  Additional stickers are not available.   Please do not apply patch if you will be having a Nuclear Stress Test, Echocardiogram, Cardiac CT, MRI, or Chest Xray during the time frame you would be wearing the monitor. The patch cannot be worn during these tests.  You cannot remove and re-apply the ZIO XT patch monitor.   Your ZIO patch monitor will be sent USPS Priority mail from [facility] directly to your home address. The monitor may also be mailed to a PO BOX if home delivery is not available.   It may take 3-5 days to receive your monitor after you have been enrolled.   Once you have received you monitor, please review enclosed instructions.  Your monitor has already been registered assigning a specific monitor serial # to you.   Applying the monitor   Shave hair from upper left chest.   Hold abrader disc by orange tab.  Rub abrader in 40 strokes over left upper chest as indicated in your monitor instructions.   Clean area with 4 enclosed alcohol pads .  Use all pads to assure are is cleaned thoroughly.  Let dry.   Apply patch as indicated in  monitor instructions.  Patch will be place under collarbone on left side of chest with arrow pointing upward.   Rub patch adhesive wings for 2 minutes.Remove white label marked "1".  Remove white label marked "2".  Rub patch adhesive wings for 2 additional minutes.   While looking in a mirror, press and release button in center of patch.  A small green light will flash 3-4 times .  This will be your only indicator the monitor has been turned on.     Do not shower for the first 24 hours.  You may shower after the  first 24 hours.   Press button if you feel a symptom. You will hear a small click.  Record Date, Time and Symptom in the Patient Log Book.   When you are ready to remove patch, follow instructions on last 2 pages of Patient Log Book.  Stick patch monitor onto last page of Patient Log Book.   Place Patient Log Book in [place] box.  Use locking tab on box and tape box closed securely.  The [employer] has [employer] on it.  Please place in mailbox as soon as possible.  Your physician should have your test results approximately 7 days after the monitor has been mailed back to [facility].   Call [place] at [phone] if you have questions regarding your ZIO XT patch monitor.  Call them immediately if you see an orange light blinking on your monitor.   If your monitor falls off in less than 4 days contact our Monitor department at [phone].  If your monitor becomes loose or falls off after 4 days call [employer] at [phone] for suggestions on securing your monitor.

## 2020-11-07 ENCOUNTER — Ambulatory Visit: Payer: PRIVATE HEALTH INSURANCE | Admitting: Cardiology

## 2020-11-24 ENCOUNTER — Telehealth: Payer: Self-pay | Admitting: Cardiology

## 2020-11-24 NOTE — Telephone Encounter (Signed)
    Pt is returning Sherri's call to get heart monitor result

## 2020-11-24 NOTE — Telephone Encounter (Signed)
Discussed monitor findings. Pt reports occasional issues since she had Covid last year in November. Exercise tolerance has been ok. Currently experiencing slight cold. Reports BPs going up and down -- this is a concern/new She would like reassurance of what to do and what to look for. Aware I am still waiting to discuss f/u w/ Camnitz. Aware I will discuss w/ him next week and let her know. She is agreeable to virtual visit if he is agreeable.

## 2020-12-01 NOTE — Telephone Encounter (Signed)
Aware to f/u w/ PCP about BP concerns/issues. Aware I will let her know next week f/u recommendation. She is agreeable to plan

## 2020-12-11 NOTE — Telephone Encounter (Signed)
Left detailed message, apologized for not returning call last week as I said I would do. Informed PRN f/u w/ Dr. Curt Bears unless she is still experiencing issues.  Advised that if she has further issues at later date we would be more than willing to see her again. Advised to call if she needed to discuss further and/or needs to scheduled d/t continued palpitations.

## 2022-04-03 ENCOUNTER — Encounter: Payer: Self-pay | Admitting: Gastroenterology

## 2022-04-23 IMAGING — DX DG HAND COMPLETE 3+V*L*
3 series · 3 of 3 positions shown · non-contrast
Comparison: None.

CLINICAL DATA: Chronic LEFT pain

EXAM:
LEFT WRIST - COMPLETE 3+ VIEW; LEFT HAND - COMPLETE 3+ VIEW

[hand pa]
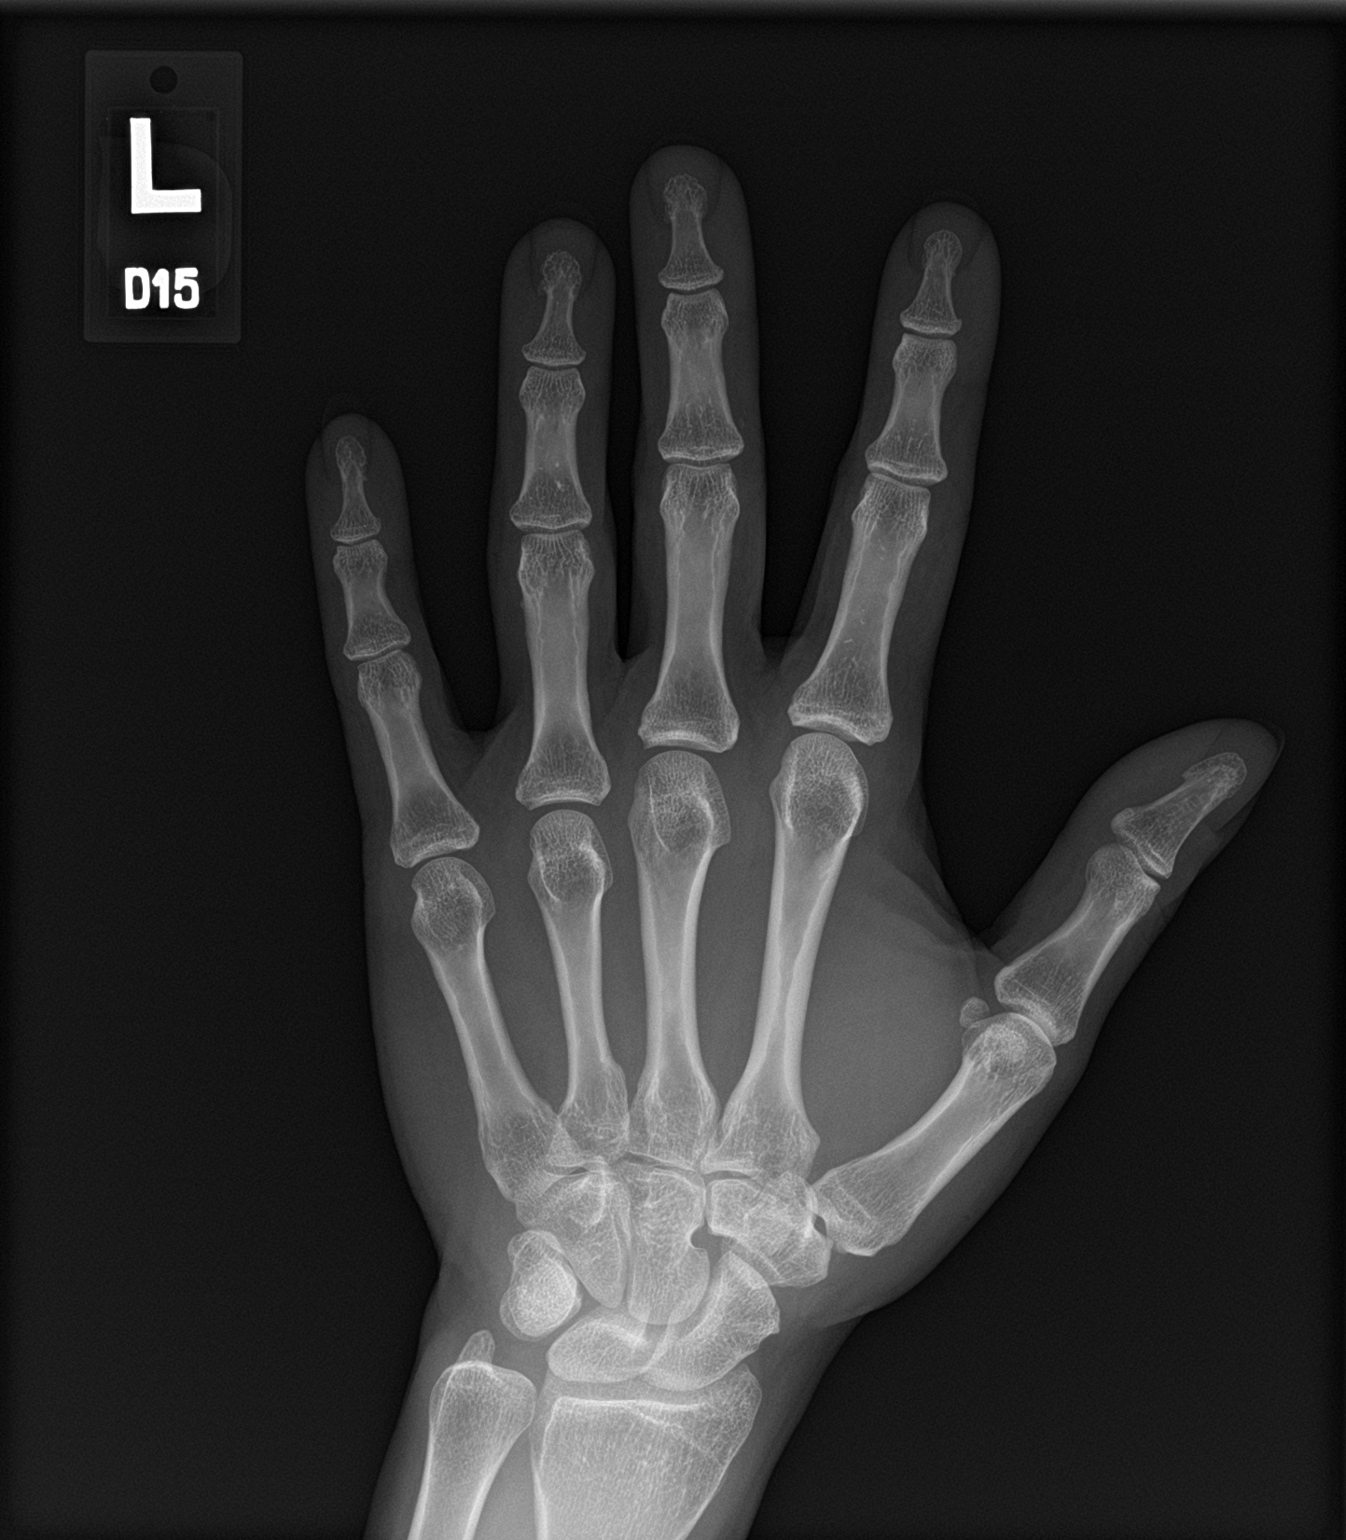

[hand obl]
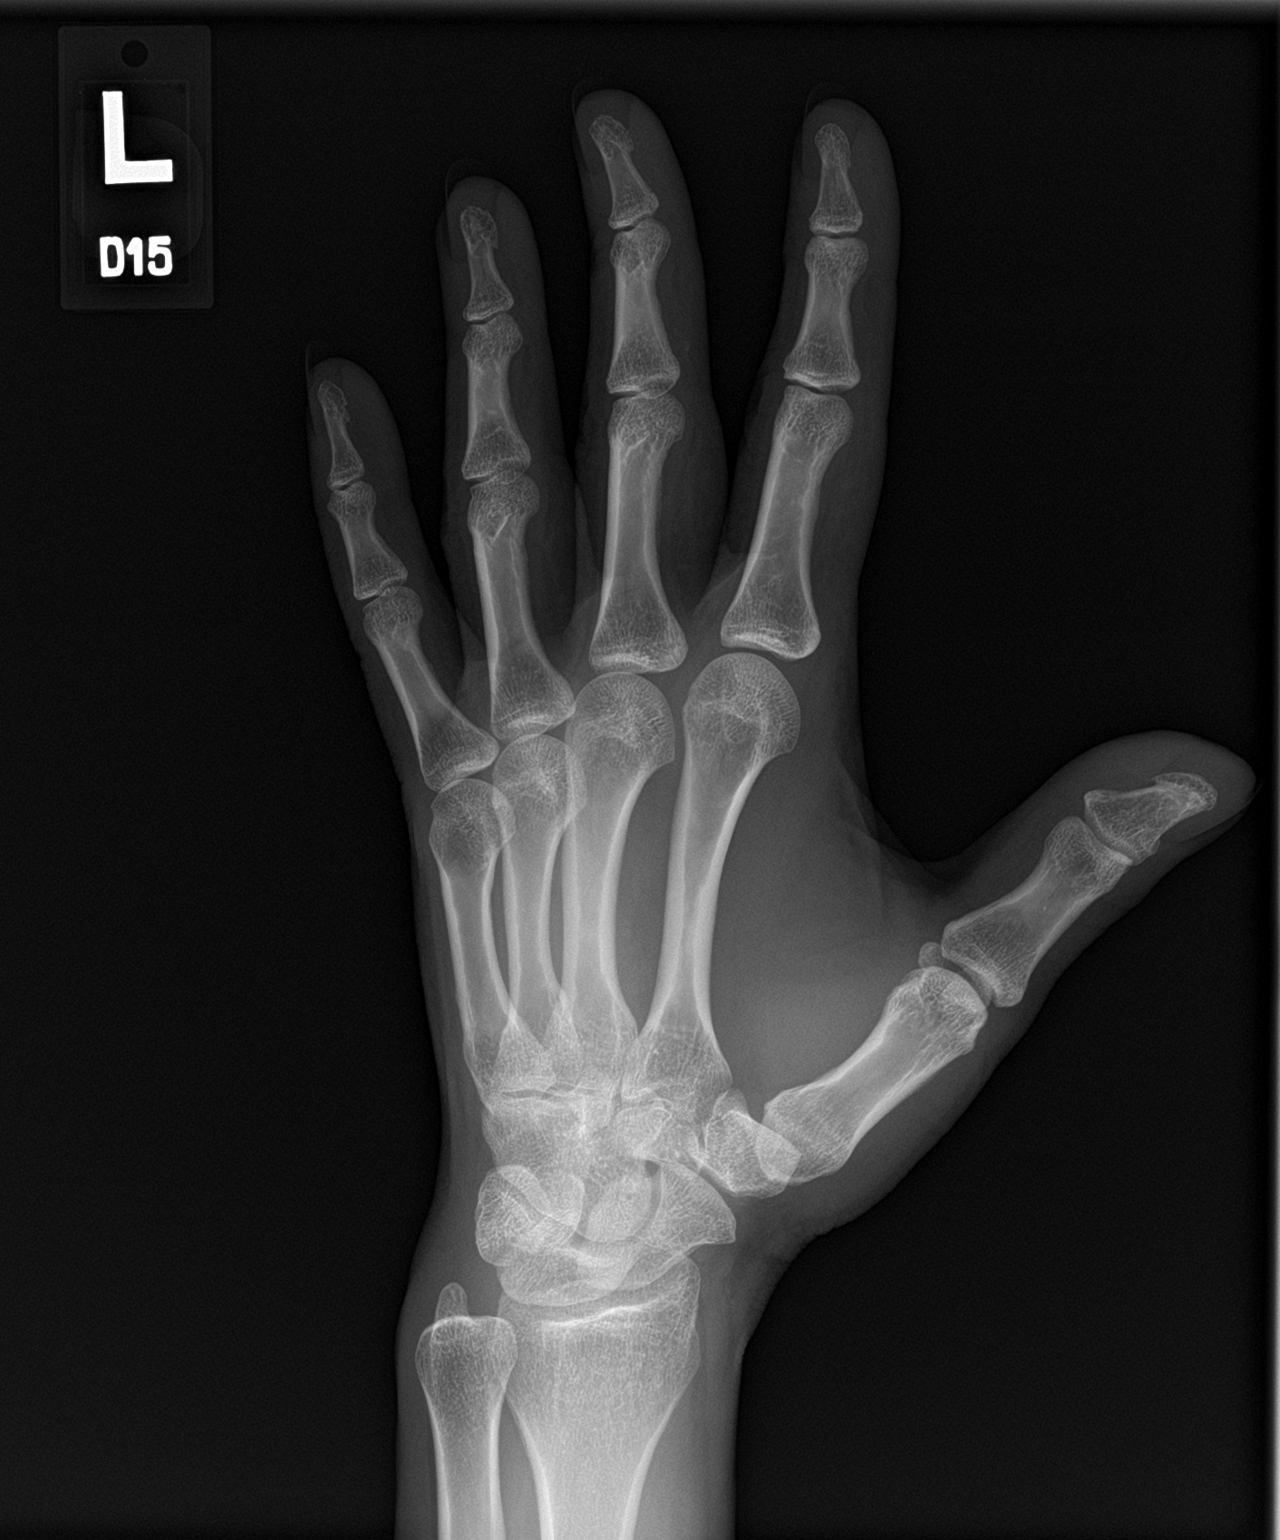

[hand lat]
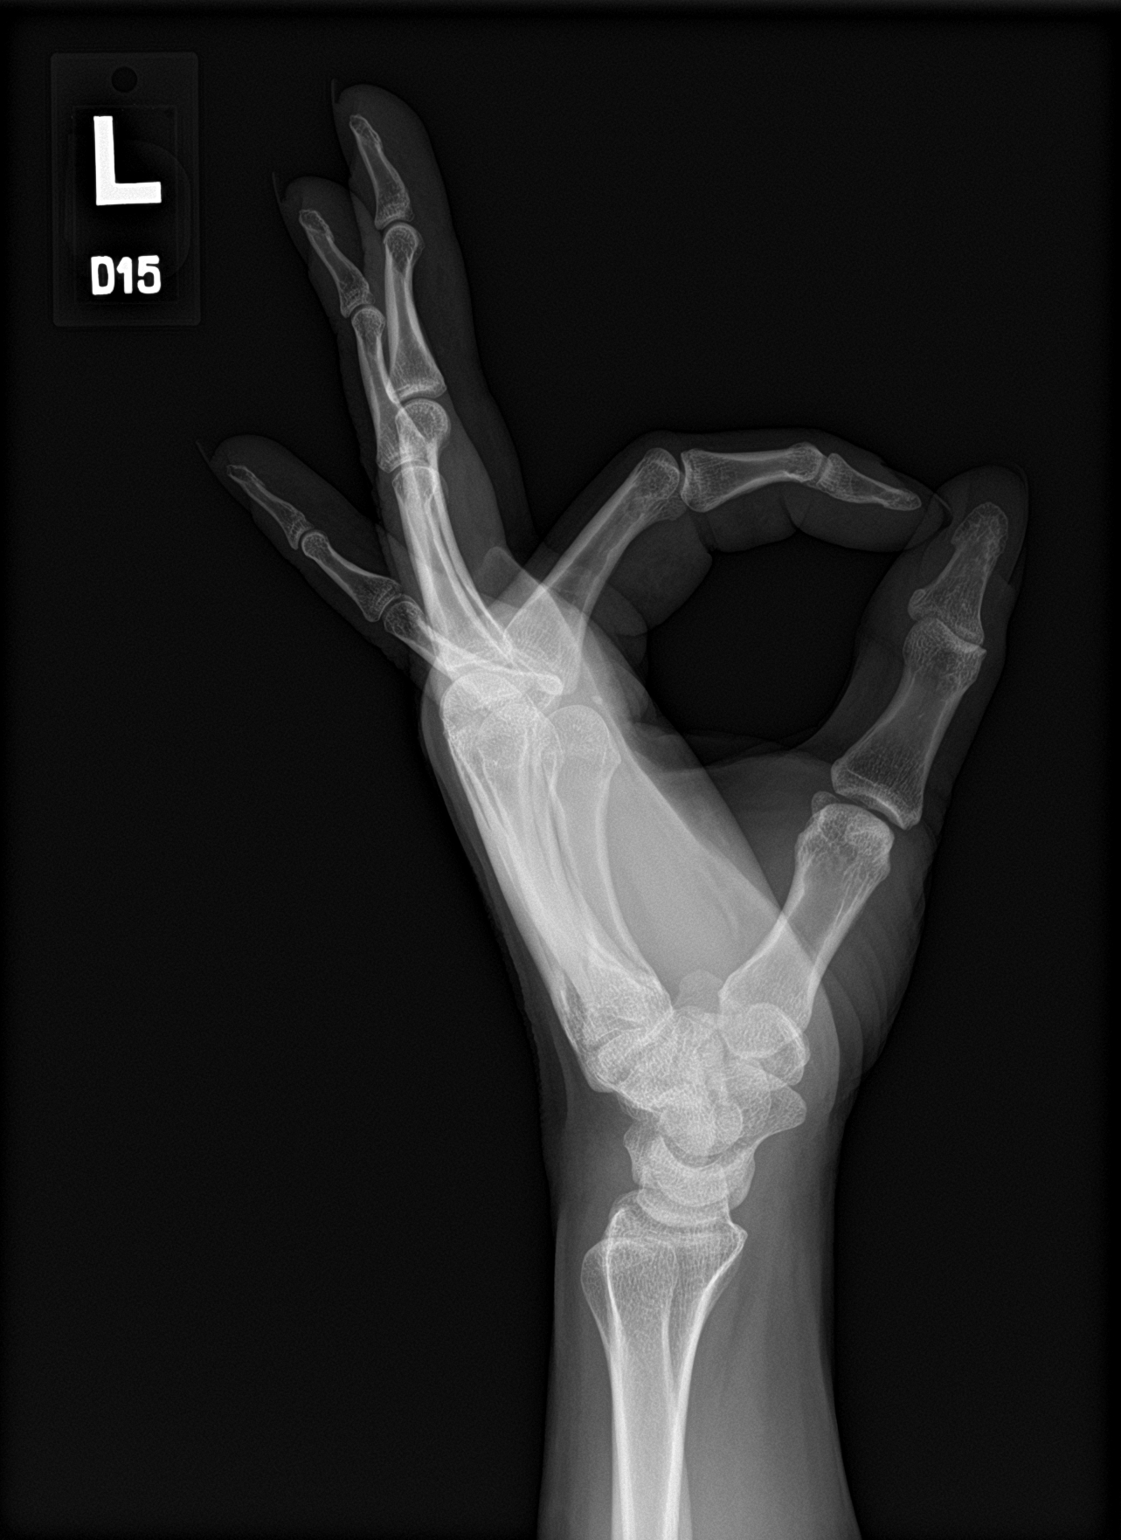

[3 of 3 positions shown; findings below may reference images not displayed]

FINDINGS: No acute fracture or dislocation. Joint spaces and alignment are
maintained. No area of erosion or osseous destruction. No unexpected
radiopaque foreign body. Soft tissues are unremarkable.
IMPRESSION: No acute osseous abnormality.

## 2022-04-24 ENCOUNTER — Encounter: Payer: Self-pay | Admitting: Gastroenterology

## 2022-04-24 ENCOUNTER — Ambulatory Visit (INDEPENDENT_AMBULATORY_CARE_PROVIDER_SITE_OTHER): Payer: No Typology Code available for payment source | Admitting: Gastroenterology

## 2022-04-24 VITALS — BP 114/74 | HR 74 | Ht 65.0 in | Wt 149.4 lb

## 2022-04-24 DIAGNOSIS — K625 Hemorrhage of anus and rectum: Secondary | ICD-10-CM

## 2022-04-24 MED ORDER — HYDROCORTISONE ACETATE 25 MG RE SUPP: 25.0000 mg | Freq: Two times a day (BID) | RECTAL | 1 refills | Status: DC

## 2022-04-24 MED ORDER — HYDROCORTISONE (PERIANAL) 2.5 % EX CREA: 1.0000 "application " | TOPICAL_CREAM | Freq: Two times a day (BID) | CUTANEOUS | 1 refills | Status: DC

## 2022-04-24 NOTE — Progress Notes (Signed)
? ?Referring Provider: Nicholes Rough, PA-C ?Primary Care Physician:  Nicholes Rough, PA-C ? ?Reason for Consultation:  Rectal bleeding ? ? ?IMPRESSION:  ?Rectal pain and bleeding previously attributed to hemorrhoids ?   - colonoscopy 10/2019 showed non-prolapsing internal hemorrhoids ?   - responded to two weeks of conservative measures ?   - recent recurrent bleeding ?History of colon polyps ?   -3 tubular adenomas removed on colonoscopy 10/2019 ?   -Surveillance recommended in 3 years ?GERD without alarm features ?   - improved after one month of H2B OTC and subsequent 2 weeks of PPI OTC ?Abdominal pain with stress ?Cholecystectomy 3 years ago ?No family history of colon cancer or polyps ?Father with d-IBS treated with Lotronex ? ?Rectal bleeding likely due to internal hemorrhoids.  ? ?No ongoing reflux at this time. No alarm features. She will resume daily PPI therapy if symptoms recur.  ? ?PLAN: ?- High fiber diet recommended, drink at least 1.5-2 liters of water every day. ?- Use a daily stool bulking agent with psyllium or methylcellulose. ?- Anusol HC 2.5% applied sparingly to your rectum twice daily with rectal bleeding. ?- Recticare samples provided. ?- Sitz Baths may provide some additional relief. ?- If you would like more information, [email or web address] and [email or web address] have good information about hemorrhoids.  ?- Referral to Dr. Fuller Plan for hemorrhoidal banding if bleeding recurs ?- I would be happy to refer you to Dr. Dema Severin to consider removal of skin tags ?- Surveillance colonoscopy 10/2022, earlier with new symptoms  ? ?HPI: Claire Flynn is a 46 y.o. female initially seen in 2020 for reflux and rectal bleeding. She returns today for further evaluation of rectal bleeding. 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 She has seasonal allergies and had a cholecystectomy for cholelithiasis at [facility].  Initially experienced an increase in diarrhea following cholecystetomy, but this resolved.  Please see my prior note for additional details about her history of reflux.  ? ?At the time of her initial consultation in 2020 she reported many years of hemorrhoids. First noticed bleeding 2019. At that time she had pain with defecation and blood in the water and on the TP. She increased fiber in her diet, used a daily dose of Metamucil, and applied hydrocortisone cream and bleeding improved within 2 weeks.  ? ?Colonoscopy performed by Dr. Fuller Plan 10/25/2019 showed perianal skin tags, a 9 mm sigmoid colon polyp, 2 7 to 8 mm sessile polyps in the transverse colon and ascending colon, a few medium mouth diverticula in the left colon, and small, not prolapsing internal hemorrhoids.  All 3 polyps were tubular adenomas.  Dr. Fuller Plan recommended local therapy with Anusol HC cream PR twice daily and RectiCare twice daily. ? ?Four weeks ago developed recurrent bleeding with defecation. Checks her stools with every defecation so was surprised by this change. Had several episodes of bleeding over 2 weeks but felt the volume and frequency was more than normal.  Although she will have constipation, no worsening constipation, straining, or senstation of rectal tearing around that time. No rectal pain. Also having some left sided gas pain last week - but this was not related to the bleeding. ? ?Did not use any local therapy at that time.   ? ?Father had severe diarrhea IBS that was diagnosed in his 26-50s. Controlled on Lotronex. No known family history of colon cancer or polyps. No family history of uterine/endometrial cancer, pancreatic cancer or gastric/stomach cancer. ?   ? ?Past Medical History:  ?  Diagnosis Date  ? Allergy   ? GERD (gastroesophageal reflux disease)   ? occasionally reflux  ? History of colon polyps   ? History of diverticulosis   ? Palpitations   ? Seasonal allergies   ? ? ?Past Surgical History:  ?Procedure Laterality Date  ? CHOLECYSTECTOMY    ? COLONOSCOPY    ? GANGLION CYST EXCISION    ? ? ?Current  Outpatient Medications  ?Medication Sig Dispense Refill  ? APPLE CIDER VINEGAR PO Take 480 mg by mouth daily.    ? B Complex Vitamins (VITAMIN B COMPLEX) TABS Take by mouth daily.    ? Cholecalciferol (VITAMIN D3) 50 MCG (2000 UT) capsule Vitamin D 2,000 unit capsule ?  1 capsule every day by oral route.    ? fexofenadine (ALLEGRA) 180 MG tablet Take 180 mg by mouth daily.    ? fluticasone (FLONASE) 50 MCG/ACT nasal spray 2 (two) times daily.    ? hydrocortisone (ANUSOL-HC) 2.5 % rectal cream Place 1 application. rectally 2 (two) times daily. 30 g 1  ? hydrocortisone (ANUSOL-HC) 25 MG suppository Place 1 suppository (25 mg total) rectally 2 (two) times daily. 28 suppository 1  ? norethindrone-ethinyl estradiol (LOESTRIN) 1-20 MG-MCG tablet Take 1 tablet by mouth daily.    ? Nutritional Supplements (VITAL HIGH PROTEIN PO) Take 10 mg by mouth daily.    ? Probiotic Product (PROBIOTIC PO) Probiotic    ? Specialty Vitamins Products (BIOTIN PLUS KERATIN PO) Take 10,000 mg by mouth daily.    ? Vitamin E 180 MG (400 UNIT) CAPS Take by mouth daily.     ? ?No current facility-administered medications for this visit.  ? ? ?Allergies as of 04/24/2022  ? (No Known Allergies)  ? ? ?Family History  ?Problem Relation Age of Onset  ? Diabetes Mother   ? Hypertension Mother   ? Irritable bowel syndrome Father   ? Colon polyps Father   ? Hypertension Father   ? Melanoma Father   ? Colon cancer Neg Hx   ? Esophageal cancer Neg Hx   ? Stomach cancer Neg Hx   ? Rectal cancer Neg Hx   ? Pancreatic cancer Neg Hx   ? ? ? ? ?Physical Exam: ?General:   Alert,  well-nourished, pleasant and cooperative in NAD ?Head:  Normocephalic and atraumatic. ?Eyes:  Sclera clear, no icterus.   Conjunctiva pink. ?Abdomen:  Soft,nontender, nondistended, normal bowel sounds, no rebound or guarding. No hepatosplenomegaly.   ?Rectal:   No chemical dermatitis. Small, non-bleeding external hemorrhoids. Skin tags. No fissure or fistula. No prolapsing hemorrhoids.  No rectal prolapse. Normal anocutaneous reflex. No stool in the rectal vault. No mass or fecal impaction. Normal anal resting tone.  Chaperone: Heather ?Neurologic:  Alert and  oriented x4;  grossly nonfocal ?Skin:  Intact without significant lesions or rashes. ?Psych:  Alert and cooperative. Normal mood and affect. ? ? ? ?Claire Niday L. Tarri Glenn, MD, MPH ?04/25/2022, 7:11 PM ? ? ? ? ?

## 2022-04-24 NOTE — Patient Instructions (Addendum)
It was my pleasure to provide care to you today. Based on our discussion, I am providing you with my recommendations below: ? ?RECOMMENDATION(S):  ? ?High fiber diet recommended. ? ?Please drink at least 1.5-2 liters of water every day. ? ?I recommend adding a daily stool bulking agent with psyllium or methylcellulose (Such as Metamucil, Benefiber, or Citrucell). ? ?I have prescribed  Anusol HC 2.5% applied sparingly to your rectum twice daily. You may also use Anusol Suppositories twice daily as needed.  ? ?Samples of Recticare were provided.   ? ?Sitz Baths may provide some additional relief. ? ?If you would like more information, [email or web address] and [email or web address] have good information about hemorrhoids.  ? ?You are due a colonoscopy in November 2023 given your history of colon polyps. ? ?You could consider seeing Dr. Dema Severin to consider removal of your skin tags.  ? ?FOLLOW UP: ? ?I would like for you to follow up with me in November 2023 or sooner if needed. Please call the office at [phone] to schedule your appointment. ? ?BMI: ? ? ?If you are age 46 or younger, your body mass index should be between 19-25. Your Body mass index is 24.86 kg/m?Marland Kitchen If this is out of the aformentioned range listed, please consider follow up with your Primary Care Provider.  ? ?MY CHART: ? ?The [place] [facility] providers would like to encourage you to use [facility] to communicate with providers for non-urgent requests or questions.  Due to long hold times on the telephone, sending your provider a message by [facility] may be a faster and more efficient way to get a response.  Please allow 48 business hours for a response.  Please remember that this is for non-urgent requests.  ? ?Thank you for trusting me with your gastrointestinal care!   ? ?Thornton Park, MD, MPH ? ?

## 2022-04-25 ENCOUNTER — Encounter: Payer: Self-pay | Admitting: Gastroenterology

## 2022-09-10 ENCOUNTER — Other Ambulatory Visit: Payer: Self-pay

## 2022-09-10 ENCOUNTER — Telehealth: Payer: Self-pay | Admitting: Gastroenterology

## 2022-09-10 ENCOUNTER — Other Ambulatory Visit (INDEPENDENT_AMBULATORY_CARE_PROVIDER_SITE_OTHER): Payer: No Typology Code available for payment source

## 2022-09-10 DIAGNOSIS — K5730 Diverticulosis of large intestine without perforation or abscess without bleeding: Secondary | ICD-10-CM | POA: Diagnosis not present

## 2022-09-10 DIAGNOSIS — R1084 Generalized abdominal pain: Secondary | ICD-10-CM

## 2022-09-10 DIAGNOSIS — R109 Unspecified abdominal pain: Secondary | ICD-10-CM

## 2022-09-10 LAB — CBC WITH DIFFERENTIAL/PLATELET
Basophils Absolute: 0.1 10*3/uL (ref 0.0–0.1)
Basophils Relative: 0.6 % (ref 0.0–3.0)
Eosinophils Absolute: 0.2 10*3/uL (ref 0.0–0.7)
Eosinophils Relative: 1.9 % (ref 0.0–5.0)
HCT: 37.4 % (ref 36.0–46.0)
Hemoglobin: 12.7 g/dL (ref 12.0–15.0)
Lymphocytes Relative: 29 % (ref 12.0–46.0)
Lymphs Abs: 2.4 10*3/uL (ref 0.7–4.0)
MCHC: 33.8 g/dL (ref 30.0–36.0)
MCV: 89.9 fl (ref 78.0–100.0)
Monocytes Absolute: 0.5 10*3/uL (ref 0.1–1.0)
Monocytes Relative: 6.1 % (ref 3.0–12.0)
Neutro Abs: 5.2 10*3/uL (ref 1.4–7.7)
Neutrophils Relative %: 62.4 % (ref 43.0–77.0)
Platelets: 263 10*3/uL (ref 150.0–400.0)
RBC: 4.16 Mil/uL (ref 3.87–5.11)
RDW: 12.3 % (ref 11.5–15.5)
WBC: 8.3 10*3/uL (ref 4.0–10.5)

## 2022-09-10 LAB — BASIC METABOLIC PANEL
BUN: 22 mg/dL (ref 6–23)
CO2: 27 mEq/L (ref 19–32)
Calcium: 9.2 mg/dL (ref 8.4–10.5)
Chloride: 101 mEq/L (ref 96–112)
Creatinine, Ser: 0.82 mg/dL (ref 0.40–1.20)
GFR: 86.18 mL/min (ref >60.00)
Glucose, Bld: 99 mg/dL (ref 70–99)
Potassium: 3.8 mEq/L (ref 3.5–5.1)
Sodium: 136 mEq/L (ref 135–145)

## 2022-09-10 NOTE — Telephone Encounter (Signed)
Patient requesting a call back regarding a new abdominal pain.Please advise

## 2022-09-10 NOTE — Telephone Encounter (Signed)
Patient called in with complaints of abdominal pain that starts from her epigastric region down to her pelvis that started yesterday. Pain is worse with movement, she describes it as a "dull" and "jarring" discomfort. She hasn't been constipated, however did take a stool softener & a laxative to see if that may have been the issue, and has had normal bowel movements. States she has a history of diverticuli seen on previous colonoscopy, and wanted to run this by Dr. Tarri Glenn. Denies any n/v or fever. Patient last seen in [place] on 04/24/22 with Dr. Tarri Glenn.

## 2022-09-10 NOTE — Telephone Encounter (Signed)
Spoke with patient regarding MD recommendations. She would prefer to schedule follow up once CT is completed.

## 2022-09-10 NOTE — Telephone Encounter (Signed)
Left message for patient to call back & sent patient a message on mychart. Patient has been scheduled for CT at [facility] on 09/12/22 at 7:30 am. She will need to arrive 15 minutes prior & remain NPO for 4 hours prior. 2 contrast bottles have been placed on 2nd floor front desk for her to pick up when she comes in for labs.

## 2022-09-10 NOTE — Telephone Encounter (Signed)
Patient scheduled for OV on 10/09/22 at 3:00 pm with Anderson Malta, [place]. She has been advised of CT & instructions prior to. No further questions.

## 2022-09-12 ENCOUNTER — Ambulatory Visit (HOSPITAL_COMMUNITY): Payer: No Typology Code available for payment source

## 2022-09-12 ENCOUNTER — Encounter (HOSPITAL_COMMUNITY): Payer: Self-pay

## 2022-09-13 ENCOUNTER — Other Ambulatory Visit: Payer: Self-pay

## 2022-09-13 ENCOUNTER — Telehealth: Payer: Self-pay

## 2022-09-13 DIAGNOSIS — Z1211 Encounter for screening for malignant neoplasm of colon: Secondary | ICD-10-CM

## 2022-09-13 NOTE — Telephone Encounter (Signed)
Received copy of CT results from [facility] stating:   IMPRESSION:  Short segment sigmoid colonic wall thickening is at least somewhat accentuated by under distension but which persists over multiple phases of imaging possibly reflecting a mild focal colitis. However, would suggest correlation with history of colon cancer screening and evaluation with colonoscopy to assess for underlying mass lesion if not previously performed.  2. No evidence of nephrolithiasis or obstructive uropathy. 3. Aortic Atherosclerosis (ICD10-170.0).   Electronically signed: By: Dahlia Bailiff M.D. On: 09/12/2022 11:45

## 2022-09-13 NOTE — Telephone Encounter (Signed)
Refer to patient mychart encounter. Patient is scheduled for OV on Monday at 1:30 pm. Confirmed with Dr. Tarri Glenn on overbooking & Nira Conn, CMA is aware.

## 2022-09-14 NOTE — Progress Notes (Unsigned)
Referring Provider: Nicholes Rough, PA-C Primary Care Physician:  Nicholes Rough, PA-C  Reason for Consultation:  Abdominal pain   IMPRESSION:  Acute abdominal pain with CT scan showing  Internal hemorrhoids with a history of rectal pain and bleeding  History of colon polyps    -3 tubular adenomas removed on colonoscopy 10/2019    -Surveillance recommended in 3 years GERD without alarm features    - improved after one month of H2B OTC and 2 weeks of PPI OTC Abdominal pain with stress Cholecystectomy 3 years ago No family history of colon cancer or polyps Father with d-IBS treated with Lotronex  Acute abdominal pain: CT scan shows a sigmoid abnormality. ? Focal colitis given her colonoscopy 2020. Stools studies with diarrhea. Plan colonoscopy, which was already due for surveillance this fall.   No ongoing reflux at this time. No alarm features. She will resume daily PPI therapy if symptoms recur.   PLAN: - Stool for GI pathogen panel and fecal calprotectin with any diarrhea - Proceed with surveillance colonoscopy early  HPI: Claire Flynn is a 46 y.o. female worked in for evaluation of abdominal pain. She was initially seen in 2020 for reflux and rectal bleeding attributed to internal hemorrhoids.  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 She has seasonal allergies and had a cholecystectomy for cholelithiasis at [facility].  Initially experienced an increase in diarrhea following cholecystetomy, but this resolved. Please see my prior note for additional details about her history of reflux.   Called 09/10/22 with abdominal pain from the epigastrium to the pelvis that is worse with movement. Pain is described as a dull and jarring pain. Concerned about diverticulosis.   No associated constipation but she took a stool softener and a laxative.   CBC and CMP were normal.   CT 09/13/22 showed:  Short segment sigmoid colonic wall thickening is at least somewhat accentuated by  under distension but which persists over multiple phases of imaging possibly reflecting a mild focal colitis. However, would suggest correlation with history of colon cancer screening and evaluation with colonoscopy to assess for underlying mass lesion if not previously performed.  2. No evidence of nephrolithiasis or obstructive uropathy. 3. Aortic Atherosclerosis (ICD10-170.0).   Colonoscopy performed by Dr. Fuller Plan 10/25/2019 showed perianal skin tags, a 9 mm sigmoid colon polyp, 2 7 to 8 mm sessile polyps in the transverse colon and ascending colon, a few medium mouth diverticula in the left colon, and small, not prolapsing internal hemorrhoids.  All 3 polyps were tubular adenomas and surveillance colonoscopy recommended in 3 years.  Dr. Fuller Plan recommended local therapy with Anusol HC cream PR twice daily and RectiCare twice daily.  Father had severe diarrhea IBS that was diagnosed in his 34-50s. Controlled on Lotronex. No known family history of colon cancer or polyps. No family history of uterine/endometrial cancer, pancreatic cancer or gastric/stomach cancer.     Past Medical History:  Diagnosis Date   Allergy    GERD (gastroesophageal reflux disease)    occasionally reflux   History of colon polyps    History of diverticulosis    Palpitations    Seasonal allergies     Past Surgical History:  Procedure Laterality Date   CHOLECYSTECTOMY     COLONOSCOPY     GANGLION CYST EXCISION      Current Outpatient Medications  Medication Sig Dispense Refill   APPLE CIDER VINEGAR PO Take 480 mg by mouth daily.     B Complex Vitamins (  VITAMIN B COMPLEX) TABS Take by mouth daily.     Cholecalciferol (VITAMIN D3) 50 MCG (2000 UT) capsule Vitamin D 2,000 unit capsule   1 capsule every day by oral route.     fexofenadine (ALLEGRA) 180 MG tablet Take 180 mg by mouth daily.     fluticasone (FLONASE) 50 MCG/ACT nasal spray 2 (two) times daily.     hydrocortisone (ANUSOL-HC) 2.5 % rectal cream Place 1  application. rectally 2 (two) times daily. 30 g 1   hydrocortisone (ANUSOL-HC) 25 MG suppository Place 1 suppository (25 mg total) rectally 2 (two) times daily. 28 suppository 1   norethindrone-ethinyl estradiol (LOESTRIN) 1-20 MG-MCG tablet Take 1 tablet by mouth daily.     Nutritional Supplements (VITAL HIGH PROTEIN PO) Take 10 mg by mouth daily.     Probiotic Product (PROBIOTIC PO) Probiotic     Specialty Vitamins Products (BIOTIN PLUS KERATIN PO) Take 10,000 mg by mouth daily.     Vitamin E 180 MG (400 UNIT) CAPS Take by mouth daily.      No current facility-administered medications for this visit.    Allergies as of 09/16/2022   (No Known Allergies)    Family History  Problem Relation Age of Onset   Diabetes Mother    Hypertension Mother    Irritable bowel syndrome Father    Colon polyps Father    Hypertension Father    Melanoma Father    Colon cancer Neg Hx    Esophageal cancer Neg Hx    Stomach cancer Neg Hx    Rectal cancer Neg Hx    Pancreatic cancer Neg Hx       Physical Exam: General:   Alert,  well-nourished, pleasant and cooperative in NAD Head:  Normocephalic and atraumatic. Eyes:  Sclera clear, no icterus.   Conjunctiva pink. Abdomen:  Soft,nontender, nondistended, normal bowel sounds, no rebound or guarding. No hepatosplenomegaly.   Rectal:   No chemical dermatitis. Small, non-bleeding external hemorrhoids. Skin tags. No fissure or fistula. No prolapsing hemorrhoids. No rectal prolapse. Normal anocutaneous reflex. No stool in the rectal vault. No mass or fecal impaction. Normal anal resting tone.  Chaperone: Heather Neurologic:  Alert and  oriented x4;  grossly nonfocal Skin:  Intact without significant lesions or rashes. Psych:  Alert and cooperative. Normal mood and affect.    Kayann Maj L. Tarri Glenn, MD, MPH 09/14/2022, 7:06 PM

## 2022-09-16 ENCOUNTER — Ambulatory Visit (INDEPENDENT_AMBULATORY_CARE_PROVIDER_SITE_OTHER): Payer: No Typology Code available for payment source | Admitting: Gastroenterology

## 2022-09-16 ENCOUNTER — Encounter: Payer: Self-pay | Admitting: Gastroenterology

## 2022-09-16 VITALS — BP 120/74 | HR 82 | Ht 65.0 in | Wt 146.6 lb

## 2022-09-16 DIAGNOSIS — R109 Unspecified abdominal pain: Secondary | ICD-10-CM

## 2022-09-16 DIAGNOSIS — R933 Abnormal findings on diagnostic imaging of other parts of digestive tract: Secondary | ICD-10-CM

## 2022-09-16 MED ORDER — NA SULFATE-K SULFATE-MG SULF 17.5-3.13-1.6 GM/177ML PO SOLN: 1.0000 | Freq: Once | ORAL | 0 refills | Status: AC

## 2022-09-16 NOTE — Patient Instructions (Addendum)
It was my pleasure to provide care to you today. Based on our discussion, I am providing you with my recommendations below:  RECOMMENDATION(S):   I am so glad that you are feeling better.  Please let me know if your symptoms return, at that point we would want to move your colonoscopy up and not wait until November.   Continue to take your fiber and probiotics.   FOLLOW UP:  After your procedure, you will receive a call from my office staff regarding my recommendation for follow up.  BMI:  If you are age 46 or older, your body mass index should be between 23-30. Your Body mass index is 24.4 kg/m. If this is out of the aforementioned range listed, please consider follow up with your Primary Care Provider.  If you are age 58 or younger, your body mass index should be between 19-25. Your Body mass index is 24.4 kg/m. If this is out of the aformentioned range listed, please consider follow up with your Primary Care Provider.   MY CHART:  The [place] [facility] providers would like to encourage you to use [facility] to communicate with providers for non-urgent requests or questions.  Due to long hold times on the telephone, sending your provider a message by [facility] may be a faster and more efficient way to get a response.  Please allow 48 business hours for a response.  Please remember that this is for non-urgent requests.   Thank you for trusting me with your gastrointestinal care!    Thornton Park, MD, MPH

## 2022-10-09 ENCOUNTER — Ambulatory Visit: Payer: No Typology Code available for payment source | Admitting: Physician Assistant

## 2022-11-08 ENCOUNTER — Encounter: Payer: Self-pay | Admitting: Gastroenterology

## 2022-11-18 ENCOUNTER — Ambulatory Visit (AMBULATORY_SURGERY_CENTER): Payer: No Typology Code available for payment source | Admitting: Gastroenterology

## 2022-11-18 ENCOUNTER — Encounter: Payer: Self-pay | Admitting: Gastroenterology

## 2022-11-18 VITALS — BP 141/80 | HR 78 | Temp 98.3°F | Resp 15 | Ht 65.0 in | Wt 146.0 lb

## 2022-11-18 DIAGNOSIS — Z86010 Personal history of colonic polyps: Secondary | ICD-10-CM

## 2022-11-18 DIAGNOSIS — Z09 Encounter for follow-up examination after completed treatment for conditions other than malignant neoplasm: Secondary | ICD-10-CM | POA: Diagnosis not present

## 2022-11-18 DIAGNOSIS — R109 Unspecified abdominal pain: Secondary | ICD-10-CM

## 2022-11-18 DIAGNOSIS — R110 Nausea: Secondary | ICD-10-CM

## 2022-11-18 DIAGNOSIS — D120 Benign neoplasm of cecum: Secondary | ICD-10-CM

## 2022-11-18 MED ORDER — SODIUM CHLORIDE 0.9 % IV SOLN: 500.0000 mL | Freq: Once | INTRAVENOUS | Status: AC

## 2022-11-18 MED ORDER — SODIUM CHLORIDE 0.9 % IV SOLN: 4.0000 mg | Freq: Once | INTRAVENOUS | Status: AC

## 2022-11-18 NOTE — Op Note (Signed)
[place] Patient Name: Claire Flynn Procedure Date: 11/18/2022 8:01 AM MRN: [id number] Endoscopist: Thornton Park MD, MD, [id number] Age: 46 Referring MD:  Date of Birth: March 05, 1976 Gender: Female Account #: [id number] Procedure:                Colonoscopy Indications:              High risk colon cancer surveillance: Personal                            history of multiple (3 or more) adenomas                           3 tubular adenomas on colonoscopy 2020 Medicines:                Monitored Anesthesia Care Procedure:                Pre-Anesthesia Assessment:                           - Prior to the procedure, a History and Physical                            was performed, and patient medications and                            allergies were reviewed. The patient's tolerance of                            previous anesthesia was also reviewed. The risks                            and benefits of the procedure and the sedation                            options and risks were discussed with the patient.                            All questions were answered, and informed consent                            was obtained. Prior Anticoagulants: The patient has                            taken no anticoagulant or antiplatelet agents. ASA                            Grade Assessment: II - A patient with mild systemic                            disease. After reviewing the risks and benefits,                            the patient was deemed in satisfactory condition to  undergo the procedure.                           After obtaining informed consent, the colonoscope                            was passed under direct vision. Throughout the                            procedure, the patient's blood pressure, pulse, and                            oxygen saturations were monitored continuously. The                            Olympus CF-HQ190L  [id number]) Colonoscope was                            introduced through the anus and advanced to the 3                            cm into the ileum. A second forward view of the                            right colon was performed. The colonoscopy was                            performed without difficulty. The patient tolerated                            the procedure well. The quality of the bowel                            preparation was excellent. The terminal ileum,                            ileocecal valve, appendiceal orifice, and rectum                            were photographed. Scope In: 8:11:28 AM Scope Out: 8:26:32 AM Scope Withdrawal Time: 0 hours 10 minutes 54 seconds  Total Procedure Duration: 0 hours 15 minutes 4 seconds  Findings:                 Skin tags were found on perianal exam.                           Non-bleeding internal hemorrhoids were found.                           Multiple large-mouthed, medium-mouthed and                            small-mouthed diverticula were found in the left  colon. Two small diverticula were seen in the right                            colon. Several diverticula were inverted.                           A 3 mm polyp was found in the cecum. The polyp was                            sessile. The polyp was removed with a cold snare.                            Resection and retrieval were complete. Estimated                            blood loss was minimal.                           The exam was otherwise without abnormality on                            direct and retroflexion views. Complications:            No immediate complications. Estimated Blood Loss:     Estimated blood loss was minimal. Impression:               - Perianal skin tags found on perianal exam.                           - Non-bleeding internal hemorrhoids.                           - Diverticulosis in the left colon.                            - One 3 mm polyp in the cecum, removed with a cold                            snare. Resected and retrieved.                           - The examination was otherwise normal on direct                            and retroflexion views. Recommendation:           - Patient has a contact number available for                            emergencies. The signs and symptoms of potential                            delayed complications were discussed with the  patient. Return to normal activities tomorrow.                            Written discharge instructions were provided to the                            patient.                           - High fiber diet.                           - Continue present medications.                           - Await pathology results.                           - Repeat colonoscopy in 5 years for surveillance,                            earlier with new symptoms.                           - Follow a high fiber diet. Drink at least 64                            ounces of water daily. Add a daily stool bulking                            agent such as psyllium (an exampled would be                            Metamucil).                           - Emerging evidence supports eating a diet of                            fruits, vegetables, grains, calcium, and yogurt                            while reducing red meat and alcohol may reduce the                            risk of colon cancer.                           - Thank you for allowing me to be involved in your                            colon cancer prevention. Thornton Park MD, MD 11/18/2022 8:33:10 AM This report has been signed electronically.

## 2022-11-18 NOTE — Progress Notes (Signed)
Called to room to assist during endoscopic procedure.  Patient ID and intended procedure confirmed with present staff. Received instructions for my participation in the procedure from the performing physician.  

## 2022-11-18 NOTE — Progress Notes (Signed)
A and O x3. Report to RN. Tolerated MAC anesthesia well. 

## 2022-11-18 NOTE — Progress Notes (Signed)
Referring Provider: Nicholes Rough, PA-C Primary Care Physician:  Nicholes Rough, PA-C  Indication for Procedure:  Colon cancer Surveillance   IMPRESSION:  Need for colon cancer surveillance Appropriate candidate for monitored anesthesia care  PLAN: Colonoscopy in the [place] today   HPI: Claire Flynn is a 46 y.o. female presents for surveillance colonoscopy.  Prior endoscopic history: - 3 tubular adenomas removed on colonoscopy 10/2019 No baseline GI symptoms.   No known family history of colon cancer or polyps. No family history of uterine/endometrial cancer, pancreatic cancer or gastric/stomach cancer.   Past Medical History:  Diagnosis Date   Allergy    GERD (gastroesophageal reflux disease)    occasionally reflux   History of colon polyps    History of diverticulosis    Palpitations    Seasonal allergies     Past Surgical History:  Procedure Laterality Date   CHOLECYSTECTOMY     COLONOSCOPY     EYE SURGERY Bilateral    bletheralplasty   GANGLION CYST EXCISION      Current Outpatient Medications  Medication Sig Dispense Refill   BALZIVA 0.4-35 MG-MCG tablet Take 1 tablet by mouth daily.     fexofenadine (ALLEGRA) 180 MG tablet Take 180 mg by mouth daily.     fluticasone (FLONASE) 50 MCG/ACT nasal spray 2 (two) times daily.     Nutritional Supplements (VITAL HIGH PROTEIN PO) Take 10 mg by mouth daily.     Probiotic Product (PROBIOTIC PO) Probiotic     Specialty Vitamins Products (BIOTIN PLUS KERATIN PO) Take 10,000 mg by mouth daily.     tretinoin (RETIN-A) 0.05 % cream APPLY A SMALL AMOUNT TO FACE NIGHTLY AT BEDTIME     Vitamin E 180 MG (400 UNIT) CAPS Take by mouth daily.      B Complex Vitamins (VITAMIN B COMPLEX) TABS Take by mouth daily.     celecoxib (CELEBREX) 200 MG capsule Take 200 mg by mouth daily.     Cholecalciferol (VITAMIN D3) 50 MCG (2000 UT) capsule Vitamin D 2,000 unit capsule   1 capsule every day by oral route.     hydrocortisone (ANUSOL-HC)  2.5 % rectal cream Place 1 application. rectally 2 (two) times daily. 30 g 1   hydrocortisone (ANUSOL-HC) 25 MG suppository Place 1 suppository (25 mg total) rectally 2 (two) times daily. (Patient not taking: Reported on 11/18/2022) 28 suppository 1   metroNIDAZOLE-Cleanser 0.75 % GEL KIT APPLY 1 APPLICATION TO FACE TWICE A DAY     Current Facility-Administered Medications  Medication Dose Route Frequency Provider Last Rate Last Admin   0.9 %  sodium chloride infusion  500 mL Intravenous Once Thornton Park, MD        Allergies as of 11/18/2022   (No Known Allergies)    Family History  Problem Relation Age of Onset   Diabetes Mother    Hypertension Mother    Irritable bowel syndrome Father    Colon polyps Father    Hypertension Father    Melanoma Father    Colon cancer Neg Hx    Esophageal cancer Neg Hx    Stomach cancer Neg Hx    Rectal cancer Neg Hx    Pancreatic cancer Neg Hx      Physical Exam: General:   Alert,  well-nourished, pleasant and cooperative in NAD Head:  Normocephalic and atraumatic. Eyes:  Sclera clear, no icterus.   Conjunctiva pink. Mouth:  No deformity or lesions.   Neck:  Supple; no masses or thyromegaly. Lungs:  Clear throughout to auscultation.   No wheezes. Heart:  Regular rate and rhythm; no murmurs. Abdomen:  Soft, non-tender, nondistended, normal bowel sounds, no rebound or guarding.  Msk:  Symmetrical. No boney deformities LAD: No inguinal or umbilical LAD Extremities:  No clubbing or edema. Neurologic:  Alert and  oriented x4;  grossly nonfocal Skin:  No obvious rash or bruise. Psych:  Alert and cooperative. Normal mood and affect.     Studies/Results: No results found.    [name] L. Tarri Glenn, MD, MPH 11/18/2022, 8:04 AM

## 2022-11-18 NOTE — Patient Instructions (Signed)
YOU HAD AN ENDOSCOPIC PROCEDURE TODAY AT [facility]:   Refer to the procedure report that was given to you for any specific questions about what was found during the examination.  If the procedure report does not answer your questions, please call your gastroenterologist to clarify.  If you requested that your care partner not be given the details of your procedure findings, then the procedure report has been included in a sealed envelope for you to review at your convenience later.  YOU SHOULD EXPECT: Some feelings of bloating in the abdomen. Passage of more gas than usual.  Walking can help get rid of the air that was put into your GI tract during the procedure and reduce the bloating. If you had a lower endoscopy (such as a colonoscopy or flexible sigmoidoscopy) you may notice spotting of blood in your stool or on the toilet paper. If you underwent a bowel prep for your procedure, you may not have a normal bowel movement for a few days.  Please Note:  You might notice some irritation and congestion in your nose or some drainage.  This is from the oxygen used during your procedure.  There is no need for concern and it should clear up in a day or so.  SYMPTOMS TO REPORT IMMEDIATELY:  Following lower endoscopy (colonoscopy or flexible sigmoidoscopy):  Excessive amounts of blood in the stool  Significant tenderness or worsening of abdominal pains  Swelling of the abdomen that is new, acute  Fever of 100F or higher   For urgent or emergent issues, a gastroenterologist can be reached at any hour by calling [phone]. Do not use MyChart messaging for urgent concerns.    DIET:  We do recommend a small meal at first, but then you may proceed to your regular diet.  Drink plenty of fluids but you should avoid alcoholic beverages for 24 hours. Follow a High Fiber diet. Drink at least 64 ounces of water daily. Add a daily stool bulking agent such as psyllium (an example would be  Metamucil). Emerging evidences supports eating a diet of fruits, vegetables, grains, calcium, and yogurt while reducing red meat and alcohol may reduce the risk of colon cancer.  MEDICATIONS: Continue present medications.  FOLLOW UP: Await pathology results. Repeat colonoscopy in 5 years for surveillance, earlier with new symptoms.  Please see handouts given to you by your recovery nurse: Polyps, diverticulosis, hemorrhoids, High Fiber diet.  Thank you for allowing [place] to provide for your healthcare needs today.  ACTIVITY:  You should plan to take it easy for the rest of today and you should NOT DRIVE or use heavy machinery until tomorrow (because of the sedation medicines used during the test).    FOLLOW UP: Our staff will call the number listed on your records the next business day following your procedure.  We will call around 7:15- 8:00 am to check on you and address any questions or concerns that you may have regarding the information given to you following your procedure. If we do not reach you, we will leave a message.     If any biopsies were taken you will be contacted by phone or by letter within the next 1-3 weeks.  Please call us at [phone] if you have not heard about the biopsies in 3 weeks.    SIGNATURES/CONFIDENTIALITY: You and/or your care partner have signed paperwork which will be entered into your electronic medical record.  These signatures attest to the fact that  that the information above on your After Visit Summary has been reviewed and is understood.  Full responsibility of the confidentiality of this discharge information lies with you and/or your care-partner.

## 2022-11-19 ENCOUNTER — Telehealth: Payer: Self-pay | Admitting: *Deleted

## 2022-11-19 NOTE — Telephone Encounter (Signed)
Post procedure follow up call placed, no answer and left VM.  

## 2022-11-20 ENCOUNTER — Encounter: Payer: Self-pay | Admitting: Gastroenterology

## 2023-02-25 ENCOUNTER — Encounter: Payer: Self-pay | Admitting: Gastroenterology

## 2023-02-26 ENCOUNTER — Other Ambulatory Visit (HOSPITAL_COMMUNITY): Payer: Self-pay | Admitting: Physician Assistant

## 2023-02-26 DIAGNOSIS — R222 Localized swelling, mass and lump, trunk: Secondary | ICD-10-CM

## 2023-02-28 ENCOUNTER — Other Ambulatory Visit: Payer: No Typology Code available for payment source

## 2023-03-04 ENCOUNTER — Ambulatory Visit (HOSPITAL_COMMUNITY)
Admission: RE | Admit: 2023-03-04 | Discharge: 2023-03-04 | Disposition: A | Discharge status: Home / Self Care | Payer: No Typology Code available for payment source | Source: Ambulatory Visit | Attending: Physician Assistant | Admitting: Physician Assistant

## 2023-03-04 DIAGNOSIS — R222 Localized swelling, mass and lump, trunk: Secondary | ICD-10-CM | POA: Diagnosis present

## 2023-03-04 MED ORDER — SODIUM CHLORIDE (PF) 0.9 % IJ SOLN
INTRAMUSCULAR | Status: AC
  Filled 2023-03-04: qty 50

## 2023-03-04 MED ORDER — IOHEXOL 300 MG/ML  SOLN
75.0000 mL | Freq: Once | INTRAMUSCULAR | Status: AC | PRN
  Administered 2023-03-04: 75 mL via INTRAVENOUS

## 2024-08-09 ENCOUNTER — Other Ambulatory Visit: Payer: Self-pay | Admitting: General Surgery

## 2024-11-08 ENCOUNTER — Other Ambulatory Visit: Payer: Self-pay

## 2024-11-08 ENCOUNTER — Encounter (HOSPITAL_BASED_OUTPATIENT_CLINIC_OR_DEPARTMENT_OTHER): Payer: Self-pay | Admitting: General Surgery

## 2024-11-08 MED ORDER — CHLORHEXIDINE GLUCONATE CLOTH 2 % EX PADS: 6.0000 | MEDICATED_PAD | Freq: Once | CUTANEOUS | Status: DC

## 2024-11-08 NOTE — Progress Notes (Signed)
   11/08/24 0926  PAT Phone Screen  Is the patient taking a GLP-1 receptor agonist? No  Do You Have Diabetes? No  Do You Have Hypertension? No  Have You Ever Been to the ER for Asthma? No  Have You Taken Oral Steroids in the Past 3 Months? No  Do you Take Phenteramine or any Other Diet Drugs? No  Recent  Lab Work, EKG, CXR? No  Do you have a history of heart problems? No  Cardiologist Name OV w/ Dr Inocencio in 2021 for palpitations, negative work up at that time. Denies any issues since.  Have you ever had tests on your heart? Yes  What cardiac tests were performed? Other (comment)  What date/year were cardiac tests completed? holter study  Results viewable: [facility] Tab  Any Recent Hospitalizations? No  Height 5' 5 (1.651 m)  Weight 68 kg  Pat Appointment Scheduled No  Reason for No Appointment Not Needed

## 2024-11-08 NOTE — Progress Notes (Signed)

## 2024-11-14 NOTE — Anesthesia Preprocedure Evaluation (Signed)
 Anesthesia Evaluation  Patient identified by MRN, date of birth, ID band Patient awake    Reviewed: Allergy & Precautions, NPO status , Patient's Chart, lab work & pertinent test results  History of Anesthesia Complications (+) PONV and history of anesthetic complications  Airway Mallampati: II  TM Distance: >3 FB Neck ROM: Full    Dental  (+) Dental Advisory Given, Teeth Intact   Pulmonary neg pulmonary ROS   Pulmonary exam normal        Cardiovascular negative cardio ROS Normal cardiovascular exam     Neuro/Psych negative neurological ROS  negative psych ROS   GI/Hepatic Neg liver ROS,GERD  Controlled,,  Endo/Other  negative endocrine ROS    Renal/GU negative Renal ROS     Musculoskeletal negative musculoskeletal ROS (+)    Abdominal   Peds  Hematology negative hematology ROS (+)   Anesthesia Other Findings   Reproductive/Obstetrics                              Anesthesia Physical Anesthesia Plan  ASA: 2  Anesthesia Plan: General   Post-op Pain Management: Tylenol  PO (pre-op)* and Celebrex  PO (pre-op)*   Induction: Intravenous  PONV Risk Score and Plan: 4 or greater and Treatment may vary due to age or medical condition, Ondansetron , Dexamethasone  and Midazolam   Airway Management Planned: LMA  Additional Equipment: None  Intra-op Plan:   Post-operative Plan: Extubation in OR  Informed Consent: I have reviewed the patients History and Physical, chart, labs and discussed the procedure including the risks, benefits and alternatives for the proposed anesthesia with the patient or authorized representative who has indicated his/her understanding and acceptance.     Dental advisory given  Plan Discussed with: CRNA and Anesthesiologist  Anesthesia Plan Comments:          Anesthesia Quick Evaluation

## 2024-11-15 ENCOUNTER — Encounter (HOSPITAL_BASED_OUTPATIENT_CLINIC_OR_DEPARTMENT_OTHER): Payer: Self-pay | Admitting: General Surgery

## 2024-11-15 ENCOUNTER — Ambulatory Visit (HOSPITAL_BASED_OUTPATIENT_CLINIC_OR_DEPARTMENT_OTHER)
Admission: RE | Admit: 2024-11-15 | Discharge: 2024-11-15 | Disposition: A | Discharge status: Home / Self Care | Attending: General Surgery | Admitting: General Surgery

## 2024-11-15 ENCOUNTER — Encounter (HOSPITAL_BASED_OUTPATIENT_CLINIC_OR_DEPARTMENT_OTHER)
Admission: RE | Disposition: A | Discharge status: Home / Self Care | Payer: Self-pay | Source: Home / Self Care | Attending: General Surgery

## 2024-11-15 ENCOUNTER — Ambulatory Visit (HOSPITAL_BASED_OUTPATIENT_CLINIC_OR_DEPARTMENT_OTHER): Payer: Self-pay | Admitting: Anesthesiology

## 2024-11-15 ENCOUNTER — Other Ambulatory Visit: Payer: Self-pay

## 2024-11-15 DIAGNOSIS — R222 Localized swelling, mass and lump, trunk: Secondary | ICD-10-CM | POA: Insufficient documentation

## 2024-11-15 DIAGNOSIS — R1909 Other intra-abdominal and pelvic swelling, mass and lump: Secondary | ICD-10-CM | POA: Diagnosis not present

## 2024-11-15 DIAGNOSIS — Z01818 Encounter for other preprocedural examination: Secondary | ICD-10-CM

## 2024-11-15 HISTORY — PX: EXCISION MASS ABDOMINAL: SHX6701

## 2024-11-15 HISTORY — DX: Nausea with vomiting, unspecified: R11.2

## 2024-11-15 LAB — POCT PREGNANCY, URINE: Preg Test, Ur: NEGATIVE

## 2024-11-15 SURGERY — EXCISION, MASS, TORSO
Anesthesia: General | Laterality: Left

## 2024-11-15 MED ORDER — LACTATED RINGERS IV SOLN: INTRAVENOUS | Status: DC

## 2024-11-15 MED ORDER — LIDOCAINE-EPINEPHRINE 1 %-1:100000 IJ SOLN
INTRAMUSCULAR | Status: AC
Start: 2024-11-15 — End: 2024-11-15
  Filled 2024-11-15: qty 1

## 2024-11-15 MED ORDER — ACETAMINOPHEN 500 MG PO TABS
ORAL_TABLET | ORAL | Status: AC
  Filled 2024-11-15: qty 2

## 2024-11-15 MED ORDER — FENTANYL CITRATE (PF) 100 MCG/2ML IJ SOLN
INTRAMUSCULAR | Status: DC | PRN
  Administered 2024-11-15 (×2): 50 ug via INTRAVENOUS

## 2024-11-15 MED ORDER — CEFAZOLIN SODIUM-DEXTROSE 2-4 GM/100ML-% IV SOLN
2.0000 g | INTRAVENOUS | Status: AC
  Administered 2024-11-15: 2 g via INTRAVENOUS

## 2024-11-15 MED ORDER — KETOROLAC TROMETHAMINE 30 MG/ML IJ SOLN
INTRAMUSCULAR | Status: AC
  Filled 2024-11-15: qty 1

## 2024-11-15 MED ORDER — CEFAZOLIN SODIUM-DEXTROSE 2-4 GM/100ML-% IV SOLN
INTRAVENOUS | Status: AC
  Filled 2024-11-15: qty 100

## 2024-11-15 MED ORDER — LIDOCAINE 2% (20 MG/ML) 5 ML SYRINGE
INTRAMUSCULAR | Status: AC
  Filled 2024-11-15: qty 5

## 2024-11-15 MED ORDER — HYDROMORPHONE HCL 1 MG/ML IJ SOLN
0.5000 mg | INTRAMUSCULAR | Status: DC | PRN
  Administered 2024-11-15 (×2): 0.5 mg via INTRAVENOUS

## 2024-11-15 MED ORDER — AMISULPRIDE (ANTIEMETIC) 5 MG/2ML IV SOLN
10.0000 mg | Freq: Once | INTRAVENOUS | Status: AC | PRN
  Administered 2024-11-15: 10 mg via INTRAVENOUS

## 2024-11-15 MED ORDER — PROPOFOL 10 MG/ML IV BOLUS
INTRAVENOUS | Status: DC | PRN
  Administered 2024-11-15: 200 mg via INTRAVENOUS
  Administered 2024-11-15: 150 ug/kg/min via INTRAVENOUS

## 2024-11-15 MED ORDER — KETOROLAC TROMETHAMINE 30 MG/ML IJ SOLN
30.0000 mg | Freq: Once | INTRAMUSCULAR | Status: AC
  Administered 2024-11-15: 30 mg via INTRAVENOUS

## 2024-11-15 MED ORDER — FENTANYL CITRATE (PF) 100 MCG/2ML IJ SOLN
INTRAMUSCULAR | Status: AC
  Filled 2024-11-15: qty 2

## 2024-11-15 MED ORDER — CELECOXIB 200 MG PO CAPS
ORAL_CAPSULE | ORAL | Status: AC
  Filled 2024-11-15: qty 1

## 2024-11-15 MED ORDER — OXYCODONE HCL 5 MG PO TABS: 5.0000 mg | ORAL_TABLET | Freq: Four times a day (QID) | ORAL | 0 refills | Status: AC | PRN

## 2024-11-15 MED ORDER — LIDOCAINE 2% (20 MG/ML) 5 ML SYRINGE
INTRAMUSCULAR | Status: DC | PRN
  Administered 2024-11-15: 60 mg via INTRAVENOUS

## 2024-11-15 MED ORDER — OXYCODONE HCL 5 MG PO TABS: 5.0000 mg | ORAL_TABLET | Freq: Once | ORAL | Status: DC | PRN

## 2024-11-15 MED ORDER — PHENYLEPHRINE 80 MCG/ML (10ML) SYRINGE FOR IV PUSH (FOR BLOOD PRESSURE SUPPORT)
PREFILLED_SYRINGE | INTRAVENOUS | Status: AC
  Filled 2024-11-15: qty 10

## 2024-11-15 MED ORDER — OXYCODONE HCL 5 MG/5ML PO SOLN: 5.0000 mg | Freq: Once | ORAL | Status: DC | PRN

## 2024-11-15 MED ORDER — ONDANSETRON HCL 4 MG/2ML IJ SOLN
INTRAMUSCULAR | Status: DC | PRN
  Administered 2024-11-15: 4 mg via INTRAVENOUS

## 2024-11-15 MED ORDER — DEXMEDETOMIDINE HCL IN NACL 80 MCG/20ML IV SOLN
INTRAVENOUS | Status: AC
  Filled 2024-11-15: qty 20

## 2024-11-15 MED ORDER — MIDAZOLAM HCL 2 MG/2ML IJ SOLN
INTRAMUSCULAR | Status: AC
  Filled 2024-11-15: qty 2

## 2024-11-15 MED ORDER — SODIUM CHLORIDE 0.9 % IV SOLN
12.5000 mg | INTRAVENOUS | Status: DC | PRN
  Filled 2024-11-15: qty 0.5

## 2024-11-15 MED ORDER — PROPOFOL 10 MG/ML IV BOLUS
INTRAVENOUS | Status: AC
  Filled 2024-11-15: qty 20

## 2024-11-15 MED ORDER — HYDROMORPHONE HCL 1 MG/ML IJ SOLN
INTRAMUSCULAR | Status: AC
  Filled 2024-11-15: qty 0.5

## 2024-11-15 MED ORDER — PHENYLEPHRINE 80 MCG/ML (10ML) SYRINGE FOR IV PUSH (FOR BLOOD PRESSURE SUPPORT)
PREFILLED_SYRINGE | INTRAVENOUS | Status: DC | PRN
  Administered 2024-11-15 (×2): 80 ug via INTRAVENOUS

## 2024-11-15 MED ORDER — ACETAMINOPHEN 500 MG PO TABS: 1000.0000 mg | ORAL_TABLET | ORAL | Status: AC

## 2024-11-15 MED ORDER — ACETAMINOPHEN 500 MG PO TABS
1000.0000 mg | ORAL_TABLET | Freq: Once | ORAL | Status: AC
  Administered 2024-11-15: 1000 mg via ORAL

## 2024-11-15 MED ORDER — BUPIVACAINE HCL (PF) 0.25 % IJ SOLN
INTRAMUSCULAR | Status: AC
  Filled 2024-11-15: qty 30

## 2024-11-15 MED ORDER — LIDOCAINE-EPINEPHRINE 1 %-1:100000 IJ SOLN
INTRAMUSCULAR | Status: DC | PRN
  Administered 2024-11-15: 15 mL

## 2024-11-15 MED ORDER — PROPOFOL 500 MG/50ML IV EMUL
INTRAVENOUS | Status: AC
  Filled 2024-11-15: qty 50

## 2024-11-15 MED ORDER — METHOCARBAMOL 500 MG PO TABS: 500.0000 mg | ORAL_TABLET | Freq: Three times a day (TID) | ORAL | 1 refills | Status: AC | PRN

## 2024-11-15 MED ORDER — CELECOXIB 200 MG PO CAPS
200.0000 mg | ORAL_CAPSULE | Freq: Once | ORAL | Status: AC
  Administered 2024-11-15: 200 mg via ORAL

## 2024-11-15 MED ORDER — HEMOSTATIC AGENTS (NO CHARGE) OPTIME
TOPICAL | Status: DC | PRN
  Administered 2024-11-15: 1 via TOPICAL

## 2024-11-15 MED ORDER — DEXAMETHASONE SOD PHOSPHATE PF 10 MG/ML IJ SOLN
INTRAMUSCULAR | Status: DC | PRN
  Administered 2024-11-15: 5 mg via INTRAVENOUS

## 2024-11-15 MED ORDER — MIDAZOLAM HCL (PF) 2 MG/2ML IJ SOLN
INTRAMUSCULAR | Status: DC | PRN
  Administered 2024-11-15: 2 mg via INTRAVENOUS

## 2024-11-15 MED ORDER — BUPIVACAINE HCL (PF) 0.25 % IJ SOLN
INTRAMUSCULAR | Status: DC | PRN
  Administered 2024-11-15: 25 mL

## 2024-11-15 MED ORDER — 0.9 % SODIUM CHLORIDE (POUR BTL) OPTIME
TOPICAL | Status: DC | PRN
  Administered 2024-11-15: 1000 mL

## 2024-11-15 MED ORDER — AMISULPRIDE (ANTIEMETIC) 5 MG/2ML IV SOLN
INTRAVENOUS | Status: AC
  Filled 2024-11-15: qty 4

## 2024-11-15 MED ORDER — HYDROMORPHONE HCL 1 MG/ML IJ SOLN
INTRAMUSCULAR | Status: DC | PRN
Start: 2024-11-15 — End: 2024-11-15
  Administered 2024-11-15: .5 mg via INTRAVENOUS

## 2024-11-15 MED ORDER — FENTANYL CITRATE (PF) 100 MCG/2ML IJ SOLN
25.0000 ug | INTRAMUSCULAR | Status: DC | PRN
  Administered 2024-11-15 (×3): 50 ug via INTRAVENOUS

## 2024-11-15 MED ORDER — ONDANSETRON HCL 4 MG/2ML IJ SOLN
INTRAMUSCULAR | Status: AC
Start: 2024-11-15 — End: 2024-11-15
  Filled 2024-11-15: qty 2

## 2024-11-15 MED ORDER — BUPIVACAINE HCL 0.25 % IJ SOLN
INTRAMUSCULAR | Status: DC | PRN
  Administered 2024-11-15: 10 mL via SURGICAL_CAVITY

## 2024-11-15 MED ORDER — PROPOFOL 10 MG/ML IV BOLUS
INTRAVENOUS | Status: AC
Start: 2024-11-15 — End: 2024-11-15
  Filled 2024-11-15: qty 20

## 2024-11-15 MED ORDER — DEXMEDETOMIDINE HCL IN NACL 80 MCG/20ML IV SOLN
INTRAVENOUS | Status: DC | PRN
Start: 2024-11-15 — End: 2024-11-15
  Administered 2024-11-15: 12 ug via INTRAVENOUS

## 2024-11-15 SURGICAL SUPPLY — 36 items
BLADE HEX COATED 2.75 (ELECTRODE) ×1 IMPLANT
BLADE SURG 10 STRL SS (BLADE) ×2 IMPLANT
CANISTER SUCT 1200ML W/VALVE (MISCELLANEOUS) IMPLANT
CHLORAPREP W/TINT 26 (MISCELLANEOUS) ×1 IMPLANT
COVER BACK TABLE 60X90IN (DRAPES) IMPLANT
COVER MAYO STAND STRL (DRAPES) IMPLANT
DERMABOND ADVANCED .7 DNX12 (GAUZE/BANDAGES/DRESSINGS) ×1 IMPLANT
DRAPE LAPAROSCOPIC ABDOMINAL (DRAPES) IMPLANT
DRAPE UTILITY XL STRL (DRAPES) ×1 IMPLANT
ELECTRODE REM PT RTRN 9FT ADLT (ELECTROSURGICAL) ×1 IMPLANT
GAUZE PAD ABD 8X10 STRL (GAUZE/BANDAGES/DRESSINGS) IMPLANT
GAUZE SPONGE 4X4 12PLY STRL LF (GAUZE/BANDAGES/DRESSINGS) ×1 IMPLANT
GLOVE BIO SURGEON STRL SZ 6 (GLOVE) ×1 IMPLANT
GLOVE BIOGEL PI IND STRL 6.5 (GLOVE) ×1 IMPLANT
GOWN STRL REUS W/ TWL LRG LVL3 (GOWN DISPOSABLE) ×1 IMPLANT
GOWN STRL REUS W/ TWL XL LVL3 (GOWN DISPOSABLE) ×1 IMPLANT
NDL HYPO 25X1 1.5 SAFETY (NEEDLE) ×1 IMPLANT
PACK BASIN DAY SURGERY FS (CUSTOM PROCEDURE TRAY) ×1 IMPLANT
PACK UNIVERSAL I (CUSTOM PROCEDURE TRAY) IMPLANT
PENCIL SMOKE EVACUATOR (MISCELLANEOUS) ×1 IMPLANT
POWDER SURGICEL 3.0 GRAM (HEMOSTASIS) IMPLANT
SLEEVE SCD COMPRESS KNEE MED (STOCKING) IMPLANT
SOLN 0.9% NACL POUR BTL 1000ML (IV SOLUTION) IMPLANT
SPIKE FLUID TRANSFER (MISCELLANEOUS) IMPLANT
SPONGE T-LAP 18X18 ~~LOC~~+RFID (SPONGE) ×1 IMPLANT
STAPLER SKIN PROX WIDE 3.9 (STAPLE) IMPLANT
STRIP CLOSURE SKIN 1/2X4 (GAUZE/BANDAGES/DRESSINGS) ×1 IMPLANT
SUT MNCRL AB 4-0 PS2 18 (SUTURE) ×1 IMPLANT
SUT SILK 3-0 18XBRD TIE BLK (SUTURE) IMPLANT
SUT VIC AB 2-0 SH 27XBRD (SUTURE) IMPLANT
SUT VICRYL 3-0 CR8 SH (SUTURE) ×1 IMPLANT
SYR BULB EAR ULCER 3OZ GRN STR (SYRINGE) ×1 IMPLANT
SYR CONTROL 10ML LL (SYRINGE) ×1 IMPLANT
TOWEL GREEN STERILE FF (TOWEL DISPOSABLE) ×1 IMPLANT
TUBE CONNECTING 20X1/4 (TUBING) IMPLANT
YANKAUER SUCT BULB TIP NO VENT (SUCTIONS) IMPLANT

## 2024-11-15 NOTE — Anesthesia Postprocedure Evaluation (Signed)
 Anesthesia Post Note  Patient: Claire Flynn  Procedure(s) Performed: EXCISION, MASS, FLANK (Left)     Patient location during evaluation: PACU Anesthesia Type: General Level of consciousness: awake and alert Pain management: pain level controlled Vital Signs Assessment: post-procedure vital signs reviewed and stable Respiratory status: spontaneous breathing, nonlabored ventilation and respiratory function stable Cardiovascular status: stable and blood pressure returned to baseline Anesthetic complications: no   No notable events documented.  Last Vitals:  Vitals:   11/15/24 1100 11/15/24 1115  BP: 120/79 110/78  Pulse: 82 78  Resp: 13 11  Temp:    SpO2: 93% 96%    Last Pain:  Vitals:   11/15/24 1115  TempSrc:   PainSc: 3                  [name] Like

## 2024-11-15 NOTE — H&P (Signed)
 Claire Flynn is an 48 y.o. female.   Chief Complaint: left flank mass HPI:  Pt is a 48 yo F with several year history of left flank mass at bra line.  It is uncomfortable with uncertain pathology based on CT imaging.    Past Medical History:  Diagnosis Date   Allergy    GERD (gastroesophageal reflux disease)    occasionally reflux   History of colon polyps    History of diverticulosis    Palpitations    PONV (postoperative nausea and vomiting)    Seasonal allergies     Past Surgical History:  Procedure Laterality Date   CHOLECYSTECTOMY     COLONOSCOPY     EYE SURGERY Bilateral    bletheralplasty   GANGLION CYST EXCISION      Family History  Problem Relation Age of Onset   Diabetes Mother    Hypertension Mother    Irritable bowel syndrome Father    Colon polyps Father    Hypertension Father    Melanoma Father    Colon cancer Neg Hx    Esophageal cancer Neg Hx    Stomach cancer Neg Hx    Rectal cancer Neg Hx    Pancreatic cancer Neg Hx    Social History:  reports that she has never smoked. She has never used smokeless tobacco. She reports current alcohol use. She reports that she does not use drugs.  Allergies:  Allergies  Allergen Reactions   Tramadol Nausea Only    Facility-Administered Medications Prior to Admission  Medication Dose Route Frequency Provider Last Rate Last Admin   0.9 %  sodium chloride  infusion  500 mL Intravenous Once Beavers, Kimberly, MD       ondansetron  (ZOFRAN ) 4 mg in sodium chloride  0.9 % 50 mL IVPB  4 mg Intravenous Once Beavers, Kimberly, MD       Medications Prior to Admission  Medication Sig Dispense Refill   B Complex Vitamins (VITAMIN B COMPLEX) TABS Take by mouth daily.     celecoxib  (CELEBREX ) 200 MG capsule Take 200 mg by mouth daily.     Cholecalciferol (VITAMIN D3) 50 MCG (2000 UT) capsule Vitamin D 2,000 unit capsule   1 capsule every day by oral route.     DHEA 10 MG TABS Take by mouth.     Drospirenone (SLYND) 4 MG  TABS Take by mouth.     fexofenadine (ALLEGRA) 180 MG tablet Take 180 mg by mouth daily.     fluticasone (FLONASE) 50 MCG/ACT nasal spray 2 (two) times daily.     methocarbamol  (ROBAXIN ) 500 MG tablet Take 500 mg by mouth every 8 (eight) hours as needed.     Nutritional Supplements (VITAL HIGH PROTEIN PO) Take 10 mg by mouth daily.     Probiotic Product (PROBIOTIC PO) Probiotic     Vitamin E 180 MG (400 UNIT) CAPS Take by mouth daily.      tretinoin (RETIN-A) 0.05 % cream APPLY A SMALL AMOUNT TO FACE NIGHTLY AT BEDTIME      Results for orders placed or performed during the hospital encounter of 11/15/24 (from the past 48 hours)  Pregnancy, urine POC     Status: None   Collection Time: 11/15/24  6:18 AM  Result Value Ref Range   Preg Test, Ur NEGATIVE NEGATIVE    Comment:        THE SENSITIVITY OF THIS METHODOLOGY IS >20 mIU/mL.    No results found.  Review of Systems  All other  systems reviewed and are negative.   Blood pressure 130/80, pulse 85, temperature 98.1 F (36.7 C), temperature source Tympanic, resp. rate 18, height 5' 5 (1.651 m), weight 68.2 kg, last menstrual period 11/08/2024, SpO2 99%. Physical Exam Vitals reviewed.  Constitutional:      General: She is not in acute distress.    Appearance: Normal appearance. She is normal weight. She is not ill-appearing.  HENT:     Head: Normocephalic and atraumatic.     Right Ear: External ear normal.     Left Ear: External ear normal.     Mouth/Throat:     Mouth: Mucous membranes are moist.  Eyes:     General: No scleral icterus.       Right eye: No discharge.        Left eye: No discharge.     Extraocular Movements: Extraocular movements intact.     Pupils: Pupils are equal, round, and reactive to light.  Cardiovascular:     Rate and Rhythm: Normal rate.  Pulmonary:     Effort: Pulmonary effort is normal. No respiratory distress.  Abdominal:     General: Abdomen is flat.  Musculoskeletal:        General: No  swelling, tenderness, deformity or signs of injury.  Skin:    General: Skin is warm and dry.     Capillary Refill: Capillary refill takes 2 to 3 seconds.     Comments: 3 cm mass left flank at bra line  Neurological:     General: No focal deficit present.     Mental Status: She is alert and oriented to person, place, and time.     Motor: No weakness.     Coordination: Coordination normal.  Psychiatric:        Mood and Affect: Mood normal.        Behavior: Behavior normal.        Thought Content: Thought content normal.        Judgment: Judgment normal.      Assessment/Plan Left flank mass  Plan excision.  Reviewed with patient.  Questions answered.   Risks of wound issues/uncertain path discussed.    Claire Flynn Nephew, MD, FACS, FSSO Surgical Oncology, General Surgery, Trauma and Critical [facility], [place] [phone] for weekday/non holidays Check [email or web address] for coverage night/weekend/holidays under General Surgery

## 2024-11-15 NOTE — Anesthesia Procedure Notes (Signed)
 Procedure Name: LMA Insertion Date/Time: 11/15/2024 8:23 AM  Performed by: Delayne Olam BIRCH, CRNAPre-anesthesia Checklist: Patient identified, Emergency Drugs available, Suction available and Patient being monitored Patient Re-evaluated:Patient Re-evaluated prior to induction Oxygen Delivery Method: Circle system utilized Preoxygenation: Pre-oxygenation with 100% oxygen Induction Type: IV induction Ventilation: Mask ventilation without difficulty LMA: LMA inserted LMA Size: 4.0 Number of attempts: 1 Airway Equipment and Method: Bite block Placement Confirmation: positive ETCO2 Tube secured with: Tape Dental Injury: Teeth and Oropharynx as per pre-operative assessment

## 2024-11-15 NOTE — Discharge Instructions (Addendum)
 Tylenol  and other NSAID'S can be taken after 12:30 pm if needed   Post Anesthesia Home Care Instructions  Activity: Get plenty of rest for the remainder of the day. A responsible individual must stay with you for 24 hours following the procedure.  For the next 24 hours, DO NOT: -Drive a car -Advertising copywriter -Drink alcoholic beverages -Take any medication unless instructed by your physician -Make any legal decisions or sign important papers.  Meals: Start with liquid foods such as gelatin or soup. Progress to regular foods as tolerated. Avoid greasy, spicy, heavy foods. If nausea and/or vomiting occur, drink only clear liquids until the nausea and/or vomiting subsides. Call your physician if vomiting continues.  Special Instructions/Symptoms: Your throat may feel dry or sore from the anesthesia or the breathing tube placed in your throat during surgery. If this causes discomfort, gargle with warm salt water. The discomfort should disappear within 24 hours.  If you had a scopolamine patch placed behind your ear for the management of post- operative nausea and/or vomiting:  1. The medication in the patch is effective for 72 hours, after which it should be removed.  Wrap patch in a tissue and discard in the trash. Wash hands thoroughly with soap and water. 2. You may remove the patch earlier than 72 hours if you experience unpleasant side effects which may include dry mouth, dizziness or visual disturbances. 3. Avoid touching the patch. Wash your hands with soap and water after contact with the patch.   [facility] Office Phone Number [phone]   POST OP INSTRUCTIONS  Always review your discharge instruction sheet given to you by the facility where your surgery was performed.  IF YOU HAVE DISABILITY OR FAMILY LEAVE FORMS, YOU MUST BRING THEM TO THE OFFICE FOR PROCESSING.  DO NOT GIVE THEM TO YOUR DOCTOR.  Take 2 tylenol  (acetominophen) three times a day for 3  days.  If you still have pain, add ibuprofen with food in between if able to take this (if you have kidney issues or stomach issues, do not take ibuprofen).  If both of those are not enough, add the narcotic pain pill.  If you find you are needing a lot of this overnight after surgery, call the next morning for a refill.   Take your usually prescribed medications unless otherwise directed If you need a refill on your pain medication, please contact your pharmacy.  They will contact our office to request authorization.  Prescriptions will not be filled after 5pm or on week-ends. You should eat very light the first 24 hours after surgery, such as soup, crackers, pudding, etc.  Resume your normal diet the day after surgery It is common to experience some constipation if taking pain medication after surgery.  Increasing fluid intake and taking a stool softener will usually help or prevent this problem from occurring.  A mild laxative (Milk of Magnesia or Miralax) should be taken according to package directions if there are no bowel movements after 48 hours. You may shower in 48 hours.  The surgical glue will flake off in 2-3 weeks.   ACTIVITIES:  No strenuous activity or heavy lifting for 1-3 weeks.   You may drive when you no longer are taking prescription pain medication, you can comfortably wear a seatbelt, and you can safely maneuver your car and apply brakes. RETURN TO WORK:  __________next week if no lifting x 2-3 weeks. _______________ Rosine should see your doctor in the office for a follow-up appointment approximately  three-four weeks after your surgery.    WHEN TO CALL YOUR DOCTOR: Fever over 101.0 Nausea and/or vomiting. Extreme swelling or bruising. Continued bleeding from incision. Increased pain, redness, or drainage from the incision.  The clinic staff is available to answer your questions during regular business hours.  Please don't hesitate to call and ask to speak to one of the nurses  for clinical concerns.  If you have a medical emergency, go to the nearest emergency room or call 911.  A surgeon from [facility] is always on call at the hospital.  For further questions, please visit [email or web address]

## 2024-11-15 NOTE — Transfer of Care (Signed)
 Immediate Anesthesia Transfer of Care Note  Patient: Claire Flynn  Procedure(s) Performed: Procedure(s) (LRB): EXCISION, MASS, FLANK (Left)  Patient Location: PACU  Anesthesia Type: General  Level of Consciousness: awake, oriented, sedated and patient cooperative  Airway & Oxygen Therapy: Patient Spontanous Breathing and Patient connected to [place] oxygen  Post-op Assessment: Report given to PACU RN and Post -op Vital signs reviewed and stable  Post vital signs: Reviewed and stable  Complications: No apparent anesthesia complications Last Vitals:  Vitals Value Taken Time  BP 113/77 11/15/24 09:45  Temp    Pulse 91 11/15/24 09:49  Resp 16 11/15/24 09:49  SpO2 99 % 11/15/24 09:49  Vitals shown include unfiled device data.  Last Pain:  Vitals:   11/15/24 0945  TempSrc:   PainSc: Asleep         Complications: No notable events documented.

## 2024-11-15 NOTE — Op Note (Signed)
 PRE-OPERATIVE DIAGNOSIS: left flank mass  POST-OPERATIVE DIAGNOSIS:  Same  PROCEDURE:  Procedure(s): Excision of left flank mass, 4 x 2.5 cm, subfascial  SURGEON:  Surgeon(s): Jina Nephew, MD  ANESTHESIA:   local and general  DRAINS: none   LOCAL MEDICATIONS USED:  BUPIVICAINE  and LIDOCAINE    SPECIMEN:  Source of Specimen:  left flank mass  DISPOSITION OF SPECIMEN:  PATHOLOGY  COUNTS:  YES  DICTATION: .Dragon Dictation  PLAN OF CARE: Discharge to home after PACU  PATIENT DISPOSITION:  PACU - hemodynamically stable.  FINDINGS:  mixed density mass under all layers of muscle with some adherence of capsule to rib  EBL: min  PROCEDURE:   Patient was identified on the area to the operating room where she was placed supine on the operating room table.  General anesthesia was induced.  Patient was then bumped into the lateral position to the right.  The left flank was prepped and draped in sterile fashion.  A timeout was performed according to the surgical safety checklist.  When I was correct, we continued.  An oblique incision was made over the mass.  This was approximately 4 cm in length.  The mass was underneath the muscles of the muscle fascia was opened.  Muscle fibers were spread to get down to the mass.  This had to be done in several layers as the mass was underneath all the muscle layers.  The mass was elevated with an Theresia and a Babcock.  The cautery was used to perform the dissection.  The deepest portion of the mass was adherent to the  periosteum of the rib.  A portion of the mass also tunneled superiorly to this.  The intercostal muscles were not disturbed.  The pleura was not encountered.  The mass was partially well encapsulated, but superiorly it seemed to be emanating from the muscle fibers.  A portion of the muscle fibers were taken en bloc with the mass.    Once the mass was removed, hemostasis is achieved with cautery and with several Vicryl sutures.  The cavity  was irrigated.  Surgicel powder was placed into the wound given some of the overall muscle surfaces that were present.  Pressure was held over this for 3 minutes.  Wound was inspected again and there was no evidence of any oozing from any of the muscle fibers.  The superficial muscle fascia was closed with a running 3-0 Vicryl.  The skin was then reapproximated with 3-0 Vicryl deep dermal interrupted sutures and 4-0 Monocryl running subcuticular sutures.  The wound was then cleaned, dried, and dressed with Dermabond.  Patient was allowed to emerge from anesthesia and was taken the PACU in stable condition.  Needle, sponge, and instrument counts were correct x 2.  Once the mass was finally able to be divided from the surrounding musculature,

## 2024-11-16 ENCOUNTER — Encounter (HOSPITAL_BASED_OUTPATIENT_CLINIC_OR_DEPARTMENT_OTHER): Payer: Self-pay | Admitting: General Surgery

## 2024-11-16 LAB — SURGICAL PATHOLOGY
